# Patient Record
Sex: Female | Born: 1980 | Race: White | Hispanic: No | Marital: Married | Smoking: Never smoker
Health system: Southern US, Academic
[De-identification: ages and names within clinical notes are randomized; demographics above are authoritative.]

---

## 2003-04-20 ENCOUNTER — Emergency Department (HOSPITAL_COMMUNITY): Payer: Self-pay | Admitting: Emergency Medicine

## 2003-12-07 ENCOUNTER — Ambulatory Visit (HOSPITAL_COMMUNITY): Payer: Self-pay

## 2003-12-14 ENCOUNTER — Ambulatory Visit (INDEPENDENT_AMBULATORY_CARE_PROVIDER_SITE_OTHER): Payer: Self-pay

## 2020-07-13 IMAGING — MR MRI LUMBAR SPINE WITHOUT AND WITH CONTRAST
9 series · 48 of 48 positions shown · IV contrast (gadavist)
Comparison: None available.

﻿EXAM:  38062   MRI LUMBAR SPINE WITHOUT AND WITH CONTRAST
INDICATION: Lower back pain with bilateral lower extremity radiculopathy.
TECHNIQUE: Multiplanar multisequential MRI of the lumbar spine was performed without and with 4 mL of Gadavist.

[Series 8: T2 · sagittal · 4.0mm · 0.94mm/px · 5 of 13 slices shown (1 of 3)]
[im 1/13]
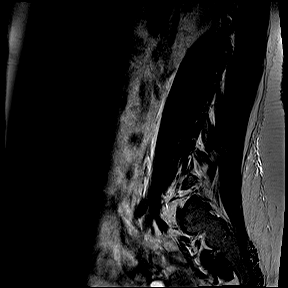
[im 4/13]
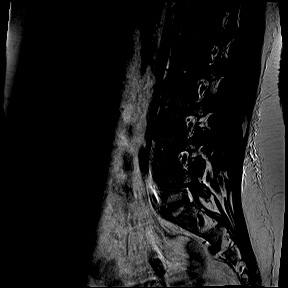
[im 7/13]
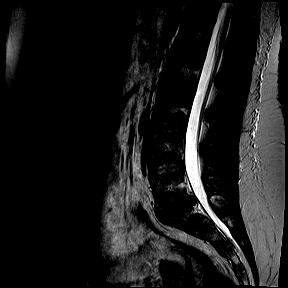
[im 10/13]
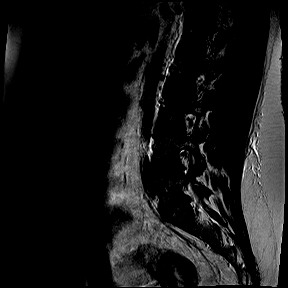
[im 13/13]
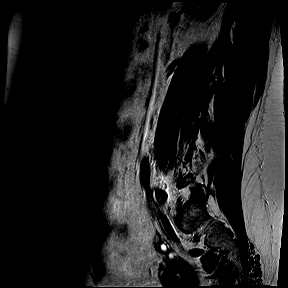

[Series 9: T1 · sagittal · 4.0mm · 0.94mm/px · 4 of 13 slices shown]
[im 1/13]
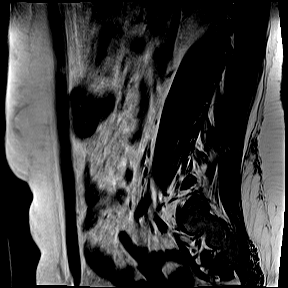
[im 5/13]
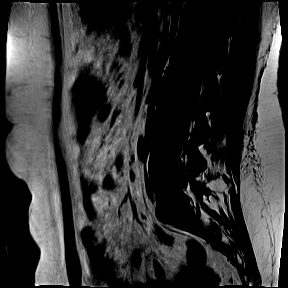
[im 9/13]
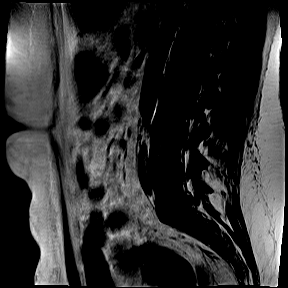
[im 13/13]
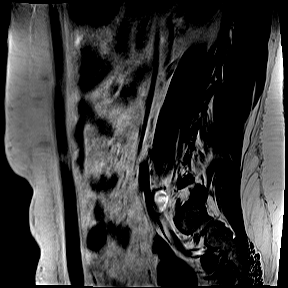

[Series 11: T1 fat-sat · sagittal · 4.0mm · 1.05mm/px · 4 of 13 slices shown (1 of 2)]
[im 1/13]
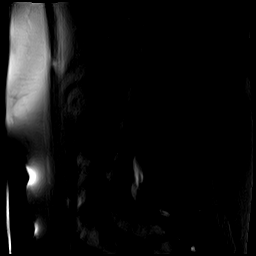
[im 5/13]
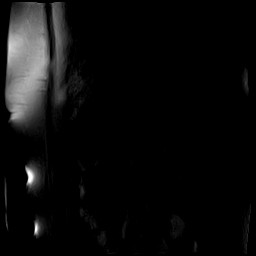
[im 9/13]
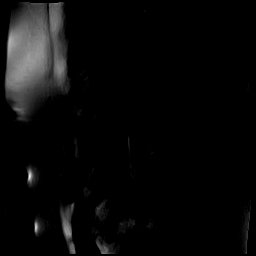
[im 13/13]
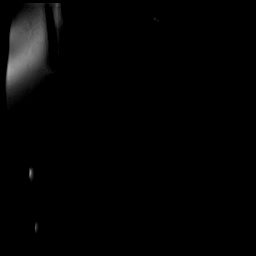

[Series 12: STIR · sagittal · 4.0mm · 1.05mm/px · 4 of 13 slices shown]
[im 1/13]
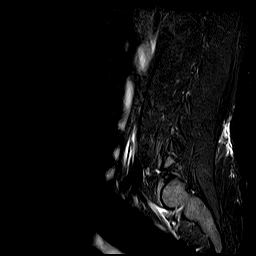
[im 5/13]
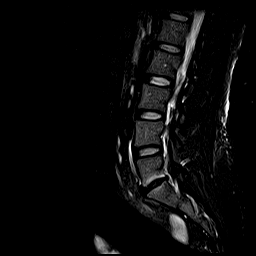
[im 9/13]
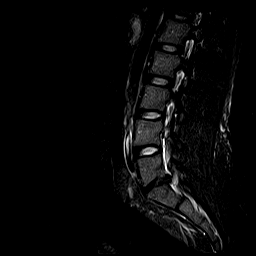
[im 13/13]
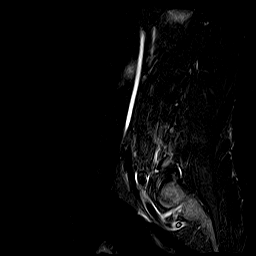

[Series 13: T2 · axial · 4.0mm · 0.47mm/px · z∈[-94,+102]mm · 7 of 23 slices shown (2 of 3)]
[im 1/23]
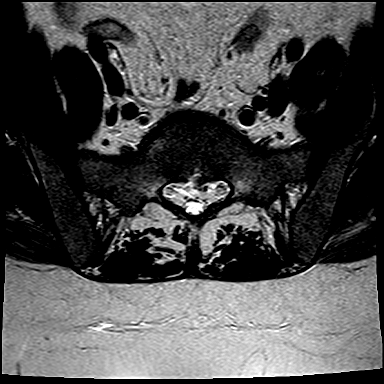
[im 4/23]
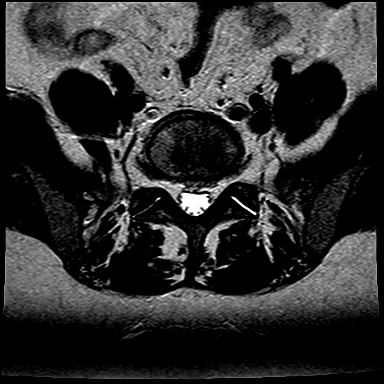
[im 8/23]
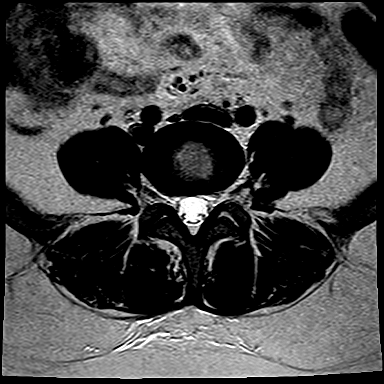
[im 12/23]
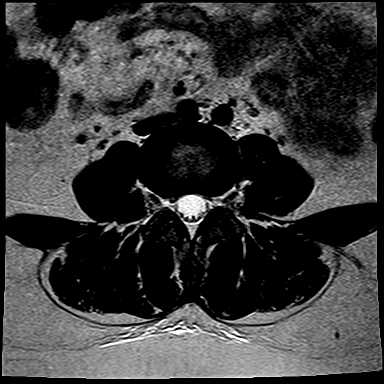
[im 15/23]
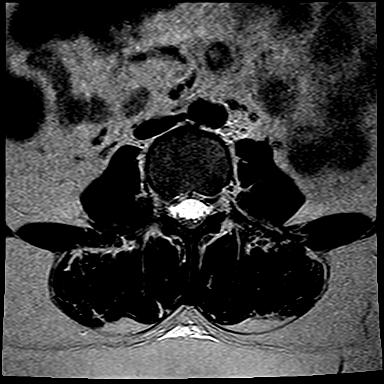
[im 19/23]
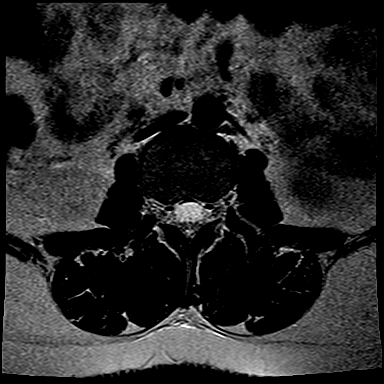
[im 23/23]
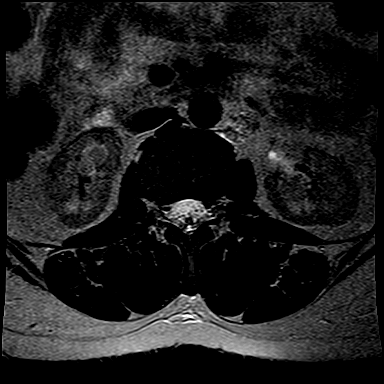

[Series 14: T1 fat-sat · axial · 4.0mm · 0.70mm/px · z∈[-94,+102]mm · 7 of 23 slices shown (2 of 2)]
[im 1/23]
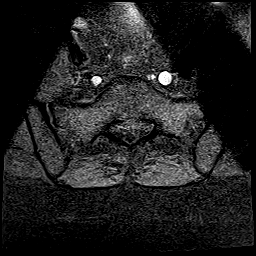
[im 4/23]
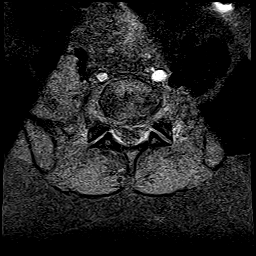
[im 8/23]
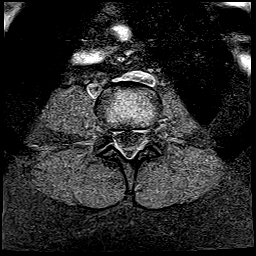
[im 12/23]
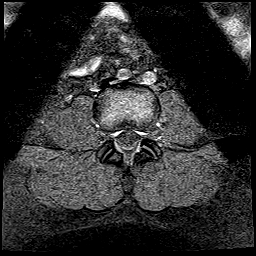
[im 15/23]
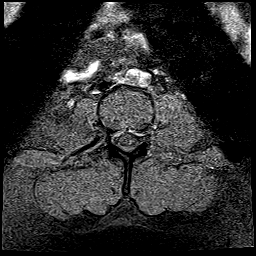
[im 19/23]
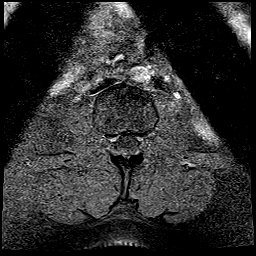
[im 23/23]
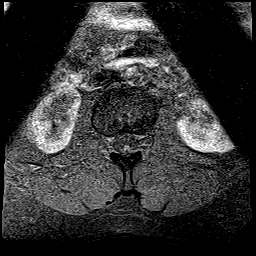

[Series 15: T1 fat-sat post-contrast · sagittal · 4.0mm · 1.05mm/px · 4 of 13 slices shown (1 of 2)]
[im 1/13]
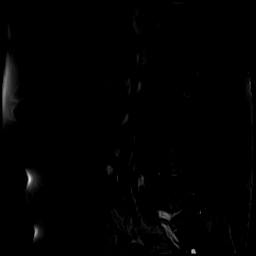
[im 5/13]
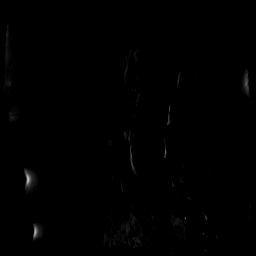
[im 9/13]
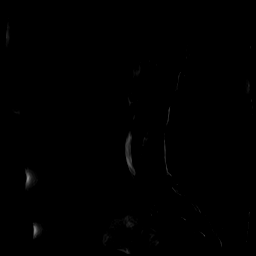
[im 13/13]
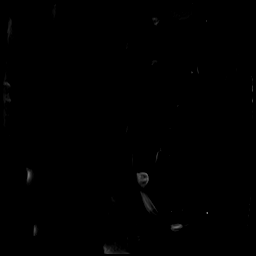

[Series 16: T1 fat-sat post-contrast · axial · 4.0mm · 0.70mm/px · z∈[-94,+102]mm · 7 of 23 slices shown (2 of 2)]
[im 1/23]
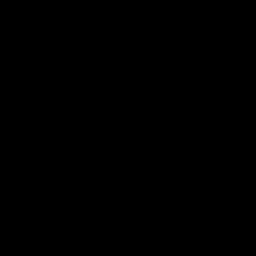
[im 4/23]
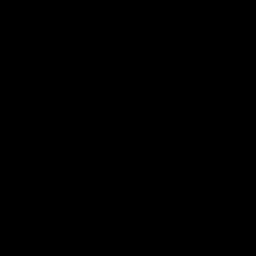
[im 8/23]
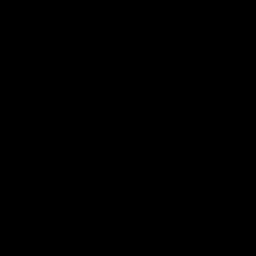
[im 12/23]
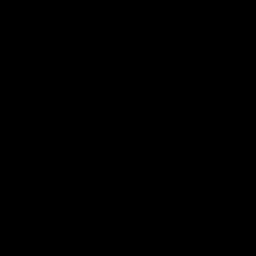
[im 15/23]
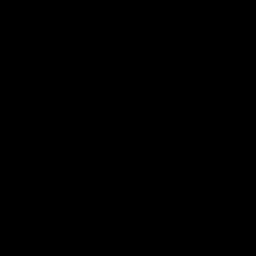
[im 19/23]
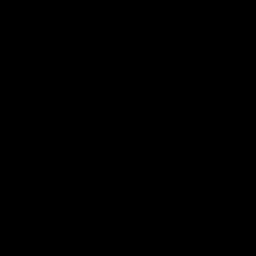
[im 23/23]
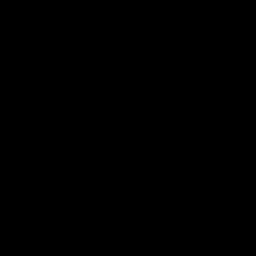

[Series 17: T2 · coronal · 4.0mm · 1.22mm/px · 6 of 20 slices shown (3 of 3)]
[im 1/20]
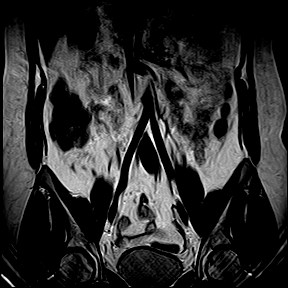
[im 4/20]
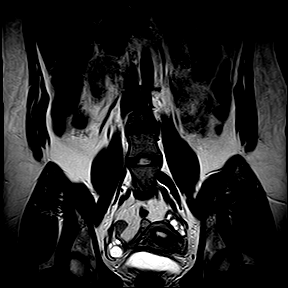
[im 8/20]
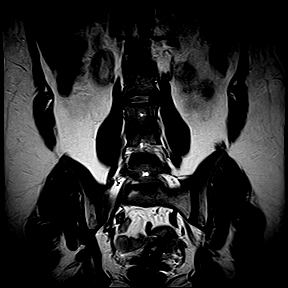
[im 12/20]
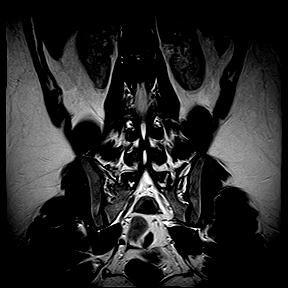
[im 16/20]
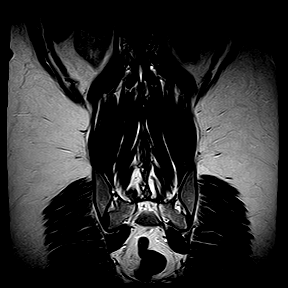
[im 20/20]
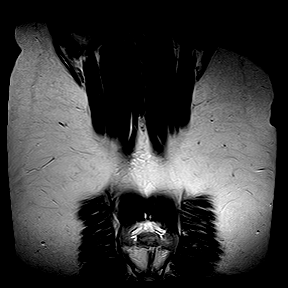

[48 of 48 positions shown; findings below may reference images not displayed]

FINDINGS: Vertebral bodies are normal in height, alignment and signal intensity. There is no acute fracture or subluxation. Distal spinal cord is normal in signal intensity and terminates normally at L1 vertebral body level. Spinal canal is congenitally narrow.

L1-2, L2-3, L3-4 and L4-5 levels are unremarkable.

At L5-S1 level, there is moderate disc desiccation. There is a moderate broad-based central and left paracentral disc bulge resulting in moderate left lateral recess compression. There is mild bilateral neural foraminal stenosis from facet arthropathy and bulging annulus without nerve root impingement.

Following intravenous contrast administration, there is no abnormal nerve root or paraspinal soft tissue enhancement.
IMPRESSION: Moderate left lateral recess compression at L5-S1 level from a moderate central and left paracentral disc bulge.

## 2020-11-01 ENCOUNTER — Ambulatory Visit: Payer: 59

## 2020-11-25 ENCOUNTER — Other Ambulatory Visit (INDEPENDENT_AMBULATORY_CARE_PROVIDER_SITE_OTHER): Payer: Self-pay | Admitting: ANESTHESIOLOGY

## 2020-11-25 DIAGNOSIS — M5126 Other intervertebral disc displacement, lumbar region: Secondary | ICD-10-CM

## 2020-11-25 DIAGNOSIS — M545 Low back pain, unspecified: Secondary | ICD-10-CM

## 2020-12-02 ENCOUNTER — Ambulatory Visit: Payer: 59 | Attending: ANESTHESIOLOGY | Admitting: ANESTHESIOLOGY

## 2020-12-02 ENCOUNTER — Other Ambulatory Visit: Payer: Self-pay

## 2020-12-02 DIAGNOSIS — M5416 Radiculopathy, lumbar region: Secondary | ICD-10-CM | POA: Insufficient documentation

## 2020-12-02 DIAGNOSIS — M545 Low back pain, unspecified: Secondary | ICD-10-CM | POA: Insufficient documentation

## 2020-12-02 DIAGNOSIS — M5126 Other intervertebral disc displacement, lumbar region: Secondary | ICD-10-CM | POA: Insufficient documentation

## 2020-12-02 NOTE — Nursing Note (Signed)
Pain and Function:     [place] Pain Rating Scale     On a scale of 0-10, during the past 24 hours, pain has interfered with you usual activity: 6     On a scale of 0-10, during the past 24 hours, pain has interfered with your sleep: 8    On a scale of 0-10, during the past 24 hours, pain has affected your mood: 7     On a scale of 0-10, during the past 24 hours, pain has contributed to your stress: 7     On a scale of 0-10, what is your overall pain Rating: 6

## 2020-12-02 NOTE — Patient Instructions (Signed)
[facility]IN [facility]NA[facility], [facility]  [address]  [place] [address]  Dept: [phone]  Dept Fax: [phone]  [phone]                                                 SPECIAL PROCEDURES                                     DISCHARGE FORM                                          [id number]      Please follow the instructions listed below for your procedures.  If you have questions concerning your procedure, you may call and leave a message.  Messages will be returned by the end of the next business day.  If you have an emergency, proceed to your local Emergency Department.      PROCEDURE: LUMBAR EPIDURAL STEROID INJECTION     Do not drive a car or operate machinery until tomorrow.   Rest today and return to normal activities tomorrow.   If you are on restricted activities by your physician, please continue to follow these.  If you are not sure, contact your physician.   It is possible to experience mild numbness of the lower back and legs.  This is temporary.   If you have soreness at the injection site, the application of heat or ice may be helpful. Mild analgesics may also be used.   In case of severe headache; lie flat to decrease it.  Increase all fluids especially those with caffeine.  Mild analgesics are also appropriate.  If headache is not relieved by these measures, contact the Pain Clinic.   Steroid injections may cause temporary increase of blood sugar levels.    These instructions have been reviewed with the patient and appropriate questions have answers.  Jerlyn Ly, LPN 72/09/2058 15:61

## 2020-12-02 NOTE — Procedures (Signed)
[facility], [facility]  [address]  [place] [address]  [phone]    PATIENT NAME: April Sullivan  CHART NGEXBM:[id number]  DATE OF BIRTH: 09/24/1980  DATE OF SERVICE:12/02/2020    SITE OF SERVICE  [facility].    Pre Procedure Diagnosis: Lumbar disc herniation, lumbar radiculopathy    Post Procedure Diagnosis: Same    Procedure: Lumbar Epidural Steroid Injection Under Fluoroscopy at  L5-S1    Attending: Sharon Seller, DO    Assistant: None    Anesthesia: Local    Estimated Blood Loss: None    Complication: None    Procedure:  After written, informed consent was obtained, the patient was taken to the procedure suite and placed in the prone position on the fluoroscopy table.  Anatomic landmarks for approach to the L5-S1 interlaminar space using fluoroscopic guidance and the patients skin was marked.  The patients skin was prepped and draped in the usual sterile fashion using Chlorhexidine.  A timeout was performed.  The skin and subcutaneous tissue was infiltrated with of 1% Lidocaine using a 25 gauge 1.5 inch needle.  The 18-gauge Tuohy needle was advanced with the aid of fluoroscopy using the loss of resistance technique at the L5-S1 interspinous space using an intralaminer approach.  Proper placement into the epidural space was confirmed by the loss of resistance.  AP and lateral radiographs were obtained to confirm proper needle tip placement. There was no CSF, heme or paresthesias.  After negative aspiration, an injectate consisting of 16mg  of Dexamethasone with 10mL of 0.2% Ropivicaine was injected.  There were no complications.   All needles were withdrawn intact and hemostasis was maintained throughout. The patient tolerated the procedure well.  The skin was cleaned and band-aids placed as appropriate.  The patient was then returned to the recovery area where they were monitored for and appropriate period of time during which they remained  hemodynamically stable and neurovascularly intact as prior to the procedure.  The patient was then discharged home in good condition with written discharge and follow up instructions.  Pre-procedure pain : 6/10  Post-procedure pain: 4/10    April Sullivan 6/10, DO 12/02/2020, 17:38

## 2020-12-02 NOTE — Progress Notes (Deleted)
[facility] Management, [facility]  [address]  [place] [address]  [phone]    PATIENT NAME: April Sullivan  CHART QQIWLN:[id number]  DATE OF BIRTH: [birth year]  DATE OF SERVICE:12/02/2020    SITE OF SERVICE  [facility].    Pre Procedure Diagnosis: Lumbar degenerative disc disease, lumbar spinal stenosis, lumbar radiculopathy    Post Procedure Diagnosis: Same    Procedure: Lumbar Epidural Steroid Injection Under Fluoroscopy at  L5-S1    Attending: Sharon Seller, DO    Assistant: None    Anesthesia: Local    Estimated Blood Loss: None    Complication: None    Procedure:  After written, informed consent was obtained, the patient was taken to the procedure suite and placed in the prone position on the fluoroscopy table.  Anatomic landmarks for approach to the L5-S1 interlaminar space using fluoroscopic guidance and the patients skin was marked.  The patients skin was prepped and draped in the usual sterile fashion using Chloraprep.  A timeout was performed.  The skin and subcutaneous tissue was infiltrated with of 1% Lidocaine using a 25 gauge 1.5 inch needle.  The 18-gauge Tuohy needle was advanced with the aid of fluoroscopy using the loss of resistance technique at the L5-S1 interspinous space using an intralaminer approach.  Proper placement into the epidural space was confirmed by the loss of resistance.  AP and lateral radiographs were obtained to confirm proper needle tip placement. There was no CSF, heme or paresthesias.  After negative aspiration, an injectate consisting of 16mg  of Dexamethasone with 13mL of 0.2% Ropivicaine was injected.  There were no complications.   All needles were withdrawn intact and hemostasis was maintained throughout. The patient tolerated the procedure well.  The skin was cleaned and band-aids placed as appropriate.  The patient was then returned to the recovery area where they were monitored for and appropriate period of time  during which they remained hemodynamically stable and neurovascularly intact as prior to the procedure.  The patient was then discharged home in good condition with written discharge and follow up instructions.  Pre-procedure pain : 6/10  Post-procedure pain: 4/10    Renlee Floor Michel Layne-Stuart, DO 12/02/2020, 10:21

## 2020-12-13 ENCOUNTER — Telehealth (INDEPENDENT_AMBULATORY_CARE_PROVIDER_SITE_OTHER): Payer: Self-pay | Admitting: Orthopaedic Surgery of the Spine

## 2020-12-13 NOTE — Nursing Note (Signed)
Message  Received: Karleen Dolphin, [name], RN  Rosemarie Beath! Can you let the patient know that we cannot prescribe anything until we see her in clinic. Thank you.          Previous Messages  Message from Rudene Re sent at 12/12/2020 1:17 PM EDT    Shea Evans pt     Pt calling in stating that she completed her appt with the pain clinic. She is still in severe pain after treatment. She would like to know if there is anything else that can be done until her appt on 11/28.   She would like to make you aware that she does not want narcotics.   Please return her call to discuss.   Thank you!        Call History     Type Contact Phone/Fax User   12/12/2020 01:13 PM EDT Phone (Incoming) Madi, Bonfiglio (Self) [phone] Rudene Re       Returned call to the patient and advised her that until she is seen we are not able to provide any script or make any recommendations other that the conservative treatment options. She verbalized understanding and thanked me for the call back.  Durenda Hurt, RN  12/13/2020, 08:16

## 2021-01-23 ENCOUNTER — Other Ambulatory Visit (INDEPENDENT_AMBULATORY_CARE_PROVIDER_SITE_OTHER): Payer: 59

## 2021-01-23 ENCOUNTER — Ambulatory Visit: Payer: 59 | Attending: Orthopaedic Surgery of the Spine | Admitting: Orthopaedic Surgery of the Spine

## 2021-01-23 ENCOUNTER — Other Ambulatory Visit: Payer: Self-pay

## 2021-01-23 ENCOUNTER — Encounter (INDEPENDENT_AMBULATORY_CARE_PROVIDER_SITE_OTHER): Payer: Self-pay | Admitting: Orthopaedic Surgery of the Spine

## 2021-01-23 VITALS — BP 116/73 | HR 80 | Temp 97.9°F | Ht 70.0 in | Wt 214.7 lb

## 2021-01-23 DIAGNOSIS — M545 Low back pain, unspecified: Secondary | ICD-10-CM

## 2021-01-23 DIAGNOSIS — M5117 Intervertebral disc disorders with radiculopathy, lumbosacral region: Secondary | ICD-10-CM

## 2021-01-23 MED ORDER — GABAPENTIN 300 MG CAPSULE
300.0000 mg | ORAL_CAPSULE | Freq: Three times a day (TID) | ORAL | 0 refills | Status: DC
Start: 2021-01-23 — End: 2023-01-09

## 2021-01-23 NOTE — H&P (Signed)
PATIENT NAME: April Sullivan, April Sullivan   HOSPITAL NUMBER:  [id number]  DATE OF SERVICE: 01/23/2021  DATE OF BIRTH:  04/10/1980    HISTORY AND PHYSICAL    HISTORY OF PRESENT ILLNESS:  This is a 40 year old new patient who presents to Dr. Mendel Corning clinic.  She has about at least 2 years of bilateral leg pain.  She said the pain is a lot worse on the left than the right.  The left leg pain goes kind of in her lateral hip, down the outside of her leg and into her left foot.  She occasionally does have the same pain on her right side.  It is worse in to her hip though, and not as bad into her right foot.  Over the past year, she has started to develop low back pain.  All this came on atraumatically.  It seems to be worsening over the recent months.  She has tried a Land, which did not help.  She did not have physical therapy.  She takes ibuprofen, but it does not really help her.  On December 02, 2020, she had L5-S1 epidural.  She said that helped for about 3 weeks.  It helped with her left leg pain almost entirely.  Her right leg pain was still present and it really did not help her back pain that much.  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  She still is able to do all of her work, but is painful.  Walking around actually helps.  Different positions such as sleeping on her back or sitting for a period of time will exacerbate her pain.    PAST MEDICAL HISTORY:  Denies.    PAST SURGICAL HISTORY:  Denies.    MEDICATIONS:  Denies.    ALLERGIES:  She is allergic to SULFA, causes a rash.    SOCIAL HISTORY:  She works full-time as an International aid/development worker at a casino in [place].  She denies tobacco use.  She drinks alcohol seldomly.    FAMILY HISTORY:  No family history.    REVIEW OF SYSTEMS:  Except as mentioned in HPI, complete review of systems was performed and all otherwise negative.    PHYSICAL EXAMINATION:  Vitals:  Blood pressure 116/73, pulse 80, temperature 36.6 degrees Celsius.  This is a well-appearing female in no apparent  distress.  She has 5/5 strength in bilateral lower extremities.  She has normal sensation in bilateral lower extremities.  She has no pathologic reflexes.  She has positive straight leg raise bilaterally that really only causes pain in her buttock.  It does not radiate down either leg.    IMAGING:  AP, lateral, flexion, and extension x-rays taken today in clinic were reviewed with Dr. Shea Evans.  Per his interpretation they show she has some disk height loss at L5-S1.  MRI from July 2022 reviewed.  Per Dr. Mendel Corning interpretation, it shows that she has a paracentral disk herniation on the left at L5-S1 that could be causing some irritation of the left S1 nerve root.    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  We explained to her that we definitely think that her disk herniation could be causing left S1 radiculopathy. We would be a little bit surprised if her right leg symptoms were coming from it.  The back pain could be coming from her disk height loss at L5-S1 as she has a lot of disk height loss at that level that is isolated.  At this  time, she has not really tried any gabapentin which we think would be a good option for her.  She also has not done any formal physical therapy.  She wrote her script for physical therapy to work on general core strengthening and low back strengthening.  We also sent her script for 300 mg t.i.d. of gabapentin.  We would like her to try this for a couple months.  She will then call the clinic to let us know how she is doing.  We explained to her that surgery for her to be complicated because we do not know if we would do just a diskectomy or if we would need to also do L5-S1 fusion.  Again, with the degenerative nature of that disease, Dr. Shea Evans is not sure if he would do it himself or if he would assistance of one of his partners since he will be retiring soon.  At this time, we will try the gabapentin and the PT.  She will call us in a couple months and  let us know how she is doing.  If she is not doing well, we would consider ordering another L5-S1 epidural or bring her back for a surgical discussion.  She is agreeable with this plan.  All questions were answered.        Roma Kayser, MD        I saw and examined the patient.  I reviewed the resident's note.  I agree with the findings and plan of care as documented in the resident's note.  Any exceptions/additions are edited/noted.  Complex case. Not classic S1 radiculopathy, moderate amount of low back pain. Functioning with this over about 3-5 years.  Unclear why would have right leg pain. Would try to re emphasize core strengthening and try Neurontin.  Could try a discectomy.  If back pain is intolerable then could do a discectomy and L5-S1 fusion, but then you buy the 30% chance of adjacent segment problems. Stay conservative for now.      Arta Silence, MD      Arta Silence, MD  Professor and Chair   [facility]                 DD:  01/23/2021 12:53:30  DT:  01/23/2021 13:50:10 CB  D#:  [id number]

## 2021-01-24 DIAGNOSIS — M5136 Other intervertebral disc degeneration, lumbar region: Secondary | ICD-10-CM

## 2021-06-07 ENCOUNTER — Telehealth (INDEPENDENT_AMBULATORY_CARE_PROVIDER_SITE_OTHER): Payer: Self-pay | Admitting: Orthopaedic Surgery

## 2021-06-07 NOTE — Nursing Note (Signed)
MRI request  Received: Today  Lucianne Lei [facility]        Message from Charlcie Cradle sent at 06/07/2021 11:33 AM EDT    Summary: MRI request    Daffner pt     Pt calling to see if she needs a new MRI for 6.7   Stating she had one done 5.2022, is asking if she needs a new one for this upcoming appt   Please advise/return call   Thank you!                   Call History     Type Contact Phone/Fax User   06/07/2021 11:32 AM EDT Phone (Incoming) Virga, [place] (Self) [phone] Judie Petit) Shawnee Knapp, Noah Early and spoke with the patient and messaged Irving Burton PA-C:  Irving Burton - I called and she isn't going any better and worse she feels. The injection lasted for 20 days. The pain is still down both hips/legs and down to her feet to the tip of her toe. It isn't all the time but getting more frequent over the last few months. She finished her PT in January but has continued to do it at home. She had got facial [facility] and was not able to go there in person but did do them. She wants to know if you want to see her first or get an MRI before she comes? Candise Bowens 4/12 @ 1345   Durenda Hurt, RN  06/07/2021, 13:55

## 2021-06-08 ENCOUNTER — Telehealth (INDEPENDENT_AMBULATORY_CARE_PROVIDER_SITE_OTHER): Payer: Self-pay | Admitting: Orthopaedic Surgery

## 2021-06-08 ENCOUNTER — Other Ambulatory Visit (INDEPENDENT_AMBULATORY_CARE_PROVIDER_SITE_OTHER): Payer: Self-pay | Admitting: Physician Assistant

## 2021-06-08 DIAGNOSIS — M5416 Radiculopathy, lumbar region: Secondary | ICD-10-CM

## 2021-06-08 NOTE — Nursing Note (Signed)
FW: MRI request  Received: Today  Courtwright, [name], RN  You can tell her that I reviewed her MRI and because she has a large disk herniation and her symptoms have not improved we will want to get an new MRI prior to seeing her ---to see if this has changed at all in order to discuss possible surgical intervention.     Order is in.     Thanks!          Previous Messages      ----- Message -----   From: Charlcie Cradle   Sent: 06/07/2021 11:33 AM EDT   To: Ortho Dr Herbie Saxon Service   Subject: MRI request                         Message from Charlcie Cradle sent at 06/07/2021 11:33 AM EDT    Summary: FW: MRI request    April Sullivan     Sullivan calling to see if she needs a new MRI for 6.7   Stating she had one done 5.2022, is asking if she needs a new one for this upcoming appt   Please advise/return call   Thank you!                 Call History     Type Contact Phone/Fax User   06/07/2021 11:32 AM EDT Phone (Incoming) Sidrah, Harden (Self) [phone] Judie Petit) Shawnee Knapp, Marolyn Hammock     Returned call to the patient and advised her to the above. She verbalized understanding and thanked me for the call back.  Durenda Hurt, RN  06/08/2021, 09:06

## 2021-08-02 ENCOUNTER — Ambulatory Visit: Payer: 59 | Attending: Orthopaedic Surgery | Admitting: Orthopaedic Surgery

## 2021-08-02 ENCOUNTER — Inpatient Hospital Stay (HOSPITAL_BASED_OUTPATIENT_CLINIC_OR_DEPARTMENT_OTHER)
Admission: RE | Admit: 2021-08-02 | Discharge: 2021-08-02 | Disposition: A | Discharge status: Home / Self Care | Payer: 59 | Source: Ambulatory Visit

## 2021-08-02 ENCOUNTER — Other Ambulatory Visit: Payer: Self-pay

## 2021-08-02 ENCOUNTER — Encounter (INDEPENDENT_AMBULATORY_CARE_PROVIDER_SITE_OTHER): Payer: Self-pay | Admitting: Orthopaedic Surgery

## 2021-08-02 VITALS — BP 129/87 | HR 99 | Ht 70.0 in | Wt 217.4 lb

## 2021-08-02 DIAGNOSIS — M5126 Other intervertebral disc displacement, lumbar region: Secondary | ICD-10-CM

## 2021-08-02 DIAGNOSIS — M5416 Radiculopathy, lumbar region: Secondary | ICD-10-CM

## 2021-08-02 DIAGNOSIS — M5136 Other intervertebral disc degeneration, lumbar region: Secondary | ICD-10-CM

## 2021-08-02 DIAGNOSIS — M533 Sacrococcygeal disorders, not elsewhere classified: Secondary | ICD-10-CM | POA: Insufficient documentation

## 2021-08-02 NOTE — Progress Notes (Signed)
[facility]  [address]  [place] [address]  Operated by [facility]     Name: April Sullivan MRN:  [id number]   Date: 08/02/2021 Age: 41 y.o.     PROGRESS NOTE    SUBJECTIVE  Patient is a 41 y.o. female who presents to the office today for evaluation of low back pain and bilateral leg pain.  She was previously seen by Dr. Shea Evans regarding this.  She reports pain that radiates down the posterior upper leg and lower leg to the ankle.  She describes the pain as sharp/shooting, more intense with laying, sitting, bending, and activity.  Pain is perceived as severe.  Pain of the left leg is greater than the right.  She has tried physical therapy, chiropractic care, NSAIDs, gabapentin, and L5-S1 epidural injection.  Her only relief of pain was with the epidural injection.  She had complete relief of left leg symptoms following injection for 20 days.  Right legs symptoms worsened.  Over the past year her symptoms have significantly worsened over the past year.  Pain has been present for 5 years now.      Patient reports difficulty with walking and states that her foot drags when she walks.  She denies issues with balance.  She denies problems with dexterity and fine motor skills such as buttoning buttons, picking up coins, and dropping items. She denies recent changes in handwriting. Patient denies numbness, tingling, fever, night sweats, chills, difficulty with bladder/bowel function, and unintentional weight loss.     OBJECTIVE  Patient is a pleasant a very pleasant 41 y.o. female in no acute distress.  Patient is alert and oriented.  Mood and affect are appropriate. Respirations are non-labored and no audible wheezing is present.  Gait is antalgic. No pinpoint tenderness over greater trochanteric bursa bilaterally.  Tenderness over spinous processes and paraspinals.  SI provocation testing positive.  Straight leg raise negative.  Strength is 5/5 bilaterally in LE with ankle plantar/dorsiflexion, knee  flexion/extension, and hip flexion.  Reflexes are +2 in LE bilaterally.  No clonus present.    IMAGING  MRI of the lumbar spine obtained today was independently reviewed/interpreted by myself and Dr. Herbie Saxon today and reveals improvement of L5-S1 disc herniation, disc degeneration L5-S1.    ASSESSMENT AND PLAN   Patient is a 41 y.o. 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  She has tried physical therapy, NSAIDs, gabapentin and epidural injection without relief of symptoms.  We recommend that she try bilateral L5 selective nerve root blocks.  We will see her back in clinic 2-3 weeks after injection.  If there is no improvement in symptoms we can consider SI injection.    I have answered all questions and the patient is pleased with the plan of treatment. If the patient has any questions or concerns, the patient can contact us.     I have seen this patient in conjunction with cosigning physician    Rachel Moulds, PA-C  08/02/2021, 15:25    I personally saw and examined the patient. See mid-level's note for additional details. My findings are lumbar radicular pain.    Neta Ehlers, MD  Professor  Department of Orthopaedics        CC:  Leafy Kindle, NP  [address]  [place] [address]     Self, Referral  No address on file  Terald Sleeper, MD  Professor  Department of [place]

## 2021-08-07 DIAGNOSIS — M5137 Other intervertebral disc degeneration, lumbosacral region: Secondary | ICD-10-CM

## 2021-08-07 DIAGNOSIS — M5127 Other intervertebral disc displacement, lumbosacral region: Secondary | ICD-10-CM

## 2021-08-07 DIAGNOSIS — M9973 Connective tissue and disc stenosis of intervertebral foramina of lumbar region: Secondary | ICD-10-CM

## 2021-08-07 DIAGNOSIS — M47817 Spondylosis without myelopathy or radiculopathy, lumbosacral region: Secondary | ICD-10-CM

## 2021-08-31 ENCOUNTER — Other Ambulatory Visit: Payer: Self-pay

## 2021-08-31 ENCOUNTER — Encounter (HOSPITAL_COMMUNITY)
Admission: RE | Disposition: A | Discharge status: Home / Self Care | Payer: Self-pay | Source: Ambulatory Visit | Attending: Nuclear Radiology

## 2021-08-31 ENCOUNTER — Ambulatory Visit (HOSPITAL_COMMUNITY): Payer: 59 | Admitting: Nuclear Radiology

## 2021-08-31 ENCOUNTER — Ambulatory Visit
Admission: RE | Admit: 2021-08-31 | Discharge: 2021-08-31 | Disposition: A | Discharge status: Home / Self Care | Payer: 59 | Source: Ambulatory Visit | Attending: Nuclear Radiology | Admitting: Nuclear Radiology

## 2021-08-31 DIAGNOSIS — M5416 Radiculopathy, lumbar region: Secondary | ICD-10-CM | POA: Insufficient documentation

## 2021-08-31 SURGERY — IR NERVE BLOCK LUMBAR
Laterality: Bilateral

## 2021-08-31 MED ORDER — DEXAMETHASONE SODIUM PHOSPHATE (PF) 10 MG/ML INJECTION SOLUTION
INTRAMUSCULAR | Status: AC
Start: 2021-08-31 — End: 2021-08-31
  Filled 2021-08-31: qty 2

## 2021-08-31 MED ORDER — BUPIVACAINE (PF) 0.25 % (2.5 MG/ML) INJECTION SOLUTION
INTRAMUSCULAR | Status: AC
Start: 2021-08-31 — End: 2021-08-31
  Filled 2021-08-31: qty 30

## 2021-08-31 SURGICAL SUPPLY — 2 items
NEEDLE BIOPSY 22GA 15CM CHIBA STRL DISP ASP (MED SURG SUPPLIES) ×1 IMPLANT
NEEDLE BIOPSY 22GA 15CM CHIBA_STRL DISP ASP (MED SURG SUPPLIES) ×1

## 2021-10-11 ENCOUNTER — Ambulatory Visit: Payer: 59 | Attending: Orthopaedic Surgery | Admitting: Orthopaedic Surgery

## 2021-10-11 ENCOUNTER — Other Ambulatory Visit: Payer: Self-pay

## 2021-10-11 DIAGNOSIS — M5126 Other intervertebral disc displacement, lumbar region: Secondary | ICD-10-CM | POA: Insufficient documentation

## 2021-10-11 DIAGNOSIS — M5416 Radiculopathy, lumbar region: Secondary | ICD-10-CM | POA: Insufficient documentation

## 2021-10-11 DIAGNOSIS — M5418 Radiculopathy, sacral and sacrococcygeal region: Secondary | ICD-10-CM

## 2021-10-11 NOTE — Progress Notes (Signed)
[facility]  [address]  [place] [address]  Operated by [facility]     Name: April Sullivan MRN:  [id number]   Date: 10/11/2021 Age: 41 y.o.     PROGRESS NOTE    SUBJECTIVE  Patient is a 41 y.o. female who  presents to the office today for evaluation post IR nerve block L5-S1injection.  She reports after injection, the pain on her  right side improves almost 90% but the left side is the worse side. She stated if she had option to take away pain from one side would be left side.  She reports pain radiates down to buttock and upper leg.she describe the left sided pain as sharp, more intense with laying on her back, standing and sitting for long period of time. She reports she has adjustable bed at home and she tried changing they position she sleeps but she is not comfortable sleeping on her left side. Today she reports her right sided symptoms is better since the injection so she is pleased about it.  She reports gabapentin takes the edge away of the pain.     Patient reports difficulty with mostly bending position and laying on her back.  She denies issues with balance.  She denies problems with dexterity and fine motor skills such as buttoning buttons, picking up coins, and dropping items. . Patient denies numbness,  reports  some tingling on the left leg/foot ,  denies fever, night sweats, chills, difficulty with bladder/bowel function, and unintentional weight loss.     OBJECTIVE  Patient is a pleasant a very pleasant 41 y.o. female in no acute distress.  Patient is alert and oriented.  Marland Kitchen Respirations clear on auscultation bilaterally.  No weakness noted on one leg up testing today.  Tenderness noted over at [facility] joint.  SI provocation testing positive.  .  Strength is 5/5 bilaterally in LE with ankle plantar/dorsiflexion, knee flexion/extension, and hip flexion.  Reflexes are +2 in LE bilaterally.  No clonus present.    IMAGING   No images were done today. MRI of the lumbar spine  from  previous visit  showed improvement of L5-S1 disc herniation (compare to the past MRI),  disc degeneration L5-S1.    ASSESSMENT AND PLAN   Patient is a 41 y.o. 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   She had relief on her right leg after L5 selective nerve root blocks. We recommend that she try left S1 nerve root blocks, orders in place.  She is agreeable with the plan. We will see her back in clinic 2-3 weeks after injection.  In past She has tried physical therapy, NSAIDs, gabapentin and epidural injection without  much relief of symptoms . If there is no improvement in symptoms we can consider SI injection.    I have answered all questions and the patient is pleased with the plan of treatment. If the patient has any questions or concerns, the patient can contact us.     I have seen this patient in conjunction with cosigning physician    Mousumi Pinki, APRN,NP-C 10/11/2021, 11:25     I personally saw and examined the patient. See mid-level's note for additional details. My findings are S1 radicular pain     Neta Ehlers, MD  Professor  Department of Orthopaedics          CC:  Leafy Kindle, NP  [address]  [place] [address]     No referring provider defined for this encounter.

## 2021-11-16 ENCOUNTER — Other Ambulatory Visit: Payer: Self-pay

## 2021-11-16 ENCOUNTER — Encounter (HOSPITAL_COMMUNITY): Admission: RE | Disposition: A | Discharge status: Home / Self Care | Payer: Self-pay | Source: Ambulatory Visit

## 2021-11-16 ENCOUNTER — Ambulatory Visit
Admission: RE | Admit: 2021-11-16 | Discharge: 2021-11-16 | Disposition: A | Discharge status: Home / Self Care | Payer: 59 | Source: Ambulatory Visit | Attending: Nuclear Radiology | Admitting: Nuclear Radiology

## 2021-11-16 DIAGNOSIS — M5126 Other intervertebral disc displacement, lumbar region: Secondary | ICD-10-CM | POA: Insufficient documentation

## 2021-11-16 SURGERY — IR NERVE BLOCK LUMBAR
Laterality: Left

## 2021-11-16 MED ORDER — DEXAMETHASONE SODIUM PHOSPHATE (PF) 10 MG/ML INJECTION SOLUTION
INTRAMUSCULAR | Status: AC
Start: 2021-11-16 — End: 2021-11-16
  Filled 2021-11-16: qty 2

## 2021-11-16 MED ORDER — BUPIVACAINE (PF) 0.25 % (2.5 MG/ML) INJECTION SOLUTION
INTRAMUSCULAR | Status: AC
Start: 2021-11-16 — End: 2021-11-16
  Filled 2021-11-16: qty 30

## 2021-11-16 SURGICAL SUPPLY — 3 items
CONV USE 337891 - PACK SURG CSTM EPIDRL STRD INJ STRL DISP NRB LF (DRAPE/PACKS/SHEETS/OR TOWEL) ×1
CONV USE 337891 - PACK SURG CUSTOM EPIDRL STRD INJ STRL DISP NRB LF (DRAPE/PACKS/SHEETS/OR TOWEL) ×1 IMPLANT
TRAY ESI NERVE ROOT BLOCK CS/8 (DRAPE/PACKS/SHEETS/OR TOWEL) ×1

## 2021-11-17 MED ORDER — IOPAMIDOL 200 MG IODINE/ML (41 %) INTRATHECAL SOLUTION
1.0000 mL | INTRATHECAL | Status: AC
Start: 2021-11-16 — End: 2021-11-16
  Administered 2021-11-16: 1 mL via EPIDURAL

## 2021-11-17 MED ORDER — BUPIVACAINE (PF) 0.5 % (5 MG/ML) INJECTION SOLUTION
5.0000 mL | INTRAMUSCULAR | Status: DC
Start: 2021-11-16 — End: 2021-12-01
  Administered 2021-11-16: 5 mL via EPIDURAL

## 2021-11-17 MED ORDER — BUPIVACAINE (PF) 0.5 % (5 MG/ML) INJECTION SOLUTION
2.0000 mL | INTRAMUSCULAR | Status: DC
Start: 2021-11-16 — End: 2021-12-01
  Administered 2021-11-16: 2 mL

## 2021-11-17 MED ORDER — DEXAMETHASONE SODIUM PHOSPHATE (PF) 10 MG/ML INJECTION SOLUTION
12.0000 mg | Freq: Once | INTRAMUSCULAR | Status: AC
Start: 2021-11-16 — End: 2021-11-16
  Administered 2021-11-16: 12 mg via EPIDURAL

## 2021-12-01 MED ORDER — BUPIVACAINE (PF) 0.25 % (2.5 MG/ML) INJECTION SOLUTION
2.0000 mL | Freq: Once | INTRAMUSCULAR | Status: AC
Start: 2021-11-16 — End: 2021-11-16
  Administered 2021-11-16: 2 mL

## 2021-12-01 MED ORDER — BUPIVACAINE (PF) 0.25 % (2.5 MG/ML) INJECTION SOLUTION
5.0000 mL | Freq: Once | INTRAMUSCULAR | Status: AC
Start: 2021-11-16 — End: 2021-11-16
  Administered 2021-11-16: 5 mL via EPIDURAL

## 2022-01-03 ENCOUNTER — Other Ambulatory Visit: Payer: Self-pay

## 2022-01-03 ENCOUNTER — Encounter (INDEPENDENT_AMBULATORY_CARE_PROVIDER_SITE_OTHER): Payer: Self-pay | Admitting: Orthopaedic Surgery

## 2022-01-03 ENCOUNTER — Ambulatory Visit: Payer: 59 | Attending: Orthopaedic Surgery | Admitting: Orthopaedic Surgery

## 2022-01-03 DIAGNOSIS — M79605 Pain in left leg: Secondary | ICD-10-CM | POA: Insufficient documentation

## 2022-01-03 DIAGNOSIS — M79604 Pain in right leg: Secondary | ICD-10-CM | POA: Insufficient documentation

## 2022-01-03 DIAGNOSIS — M545 Low back pain, unspecified: Secondary | ICD-10-CM | POA: Insufficient documentation

## 2022-01-03 DIAGNOSIS — M533 Sacrococcygeal disorders, not elsewhere classified: Secondary | ICD-10-CM | POA: Insufficient documentation

## 2022-01-03 NOTE — Progress Notes (Addendum)
[facility]  [address]  [place] [address]  Operated by [facility]     Name: April Sullivan MRN:  [id number]   Date: 01/03/2022 Age: 41 y.o. DOB: [birth year]     Progress note:      SUBJECTIVE: This is a very pleasant patient who has been followed originally by Dr. Shea Evans and then Dr. Herbie Saxon.  She has been having pain that was in her low lumbar back versus the pelvis that wraps around into the buttocks and intermittently radiates down bilateral legs and can really seem to touch any location including all the way to the feet and toes.  She notes that since her last injection, the pain seems to be sitting and emanating from a lower position. She had injections:    08/31/21 BILAT L5 TransESI It was notated in chart that she got almost 90% relief on the right side, but perhaps not very good relief from the left.   11/16/21 LEFT S1 TransESI -feels like she got pretty good relief maybe 65% relief for a few weeks maybe even greater than that.      Moving makes it worse rolling over in bed switching from sitting to standing getting up off the toilet lying on hard surface.  She gets some relief from 10s unit.  She tried PT and chiropractor without any relief.    IMAGING:  X-ray lumbar ALF he shows some degenerative disc at L5-S1 no instability    OBJECTIVE:  Patient overall well-appearing she stands easily has an easy gait she does have a lot of pain she would some random locations as she is moving around such as when she is doing single leg step-up test she can complete single leg step-up bilaterally and calf raises bilaterally without fatiguing or apparent weakness she is a negative straight leg raise there is no significant tenderness to palpations ever been accordance point bilaterally.  She has a positive Gaenslen's test, thigh thrust.  She does get some pain with distraction test not significantly over the SI joint negative compression test    A&P:  Patient with some signs of SI joint dysfunction  should the somewhat challenging pattern of pain that is little bit difficult to elicit specifically where it is coming from she does have positive provocative tests for SI joint dysfunction she is amenable to bilateral SI joint injection by CT which is ordered.  We also discussed she should follow up with her PCP she is from [place] quite far away and see about following up with Rheumatology as her pain seems to be worse in the mornings it is somewhat in the hip girdle area could consider diagnoses like PMR, or other rheumatologic conditions.  We will see the patient back after her BILAT SI joint injection 2-4 weeks and see if this gave her great relief    Cathi Roan, NP  Department of Orthopaedics-Spine    I personally saw and examined the patient. See mid-level's note for additional details. My findings are back pain, seems to localize to SI joints, will try injection     Neta Ehlers, MD  Professor  [facility] Department of Orthopaedics

## 2022-02-05 ENCOUNTER — Inpatient Hospital Stay
Admission: RE | Admit: 2022-02-05 | Discharge: 2022-02-05 | Disposition: A | Discharge status: Home / Self Care | Payer: 59 | Source: Ambulatory Visit | Attending: Nurse Practitioner | Admitting: Nurse Practitioner

## 2022-02-05 ENCOUNTER — Other Ambulatory Visit: Payer: Self-pay

## 2022-02-05 DIAGNOSIS — M5137 Other intervertebral disc degeneration, lumbosacral region: Secondary | ICD-10-CM | POA: Insufficient documentation

## 2022-02-05 DIAGNOSIS — M25552 Pain in left hip: Secondary | ICD-10-CM | POA: Insufficient documentation

## 2022-02-05 DIAGNOSIS — M545 Low back pain, unspecified: Secondary | ICD-10-CM | POA: Insufficient documentation

## 2022-02-05 DIAGNOSIS — M533 Sacrococcygeal disorders, not elsewhere classified: Secondary | ICD-10-CM

## 2022-02-05 DIAGNOSIS — M7918 Myalgia, other site: Secondary | ICD-10-CM | POA: Insufficient documentation

## 2022-02-05 DIAGNOSIS — R1030 Lower abdominal pain, unspecified: Secondary | ICD-10-CM | POA: Insufficient documentation

## 2022-02-05 DIAGNOSIS — M25551 Pain in right hip: Secondary | ICD-10-CM | POA: Insufficient documentation

## 2022-02-05 DIAGNOSIS — G8929 Other chronic pain: Secondary | ICD-10-CM | POA: Insufficient documentation

## 2022-02-05 DIAGNOSIS — M461 Sacroiliitis, not elsewhere classified: Secondary | ICD-10-CM | POA: Insufficient documentation

## 2022-02-05 MED ORDER — TRIAMCINOLONE ACETONIDE 40 MG/ML SUSPENSION FOR INJECTION
INTRAMUSCULAR | Status: AC
Start: 2022-02-05 — End: 2022-02-05
  Filled 2022-02-05: qty 5

## 2022-02-05 MED ORDER — ROPIVACAINE (PF) 5 MG/ML (0.5 %) INJECTION SOLUTION
INTRAMUSCULAR | Status: AC
Start: 2022-02-05 — End: 2022-02-05
  Filled 2022-02-05: qty 30

## 2022-02-05 MED ORDER — LIDOCAINE HCL 10 MG/ML (1 %) INJECTION SOLUTION
INTRAMUSCULAR | Status: AC
Start: 2022-02-05 — End: 2022-02-05
  Filled 2022-02-05: qty 20

## 2022-02-05 MED ORDER — ROPIVACAINE (PF) 5 MG/ML (0.5 %) INJECTION SOLUTION
1.0000 mL | Freq: Once | INTRAMUSCULAR | Status: AC
Start: 2022-02-05 — End: 2022-02-05
  Administered 2022-02-05: 1 mL via INTRA_ARTICULAR

## 2022-02-05 MED ORDER — TRIAMCINOLONE ACETONIDE 40 MG/ML SUSPENSION FOR INJECTION
40.0000 mg | Freq: Once | INTRAMUSCULAR | Status: AC
Start: 2022-02-05 — End: 2022-02-05
  Administered 2022-02-05: 40 mg via INTRA_ARTICULAR

## 2022-02-05 MED ORDER — IOPAMIDOL 300 MG IODINE/ML (61 %) INTRAVENOUS SOLUTION
1.5000 mL | INTRAVENOUS | Status: AC
Start: 2022-02-05 — End: 2022-02-05
  Administered 2022-02-05: 1.2 mL via INTRA_ARTICULAR

## 2022-02-05 MED ORDER — LIDOCAINE HCL 10 MG/ML (1 %) INJECTION SOLUTION
7.0000 mL | Freq: Once | INTRAMUSCULAR | Status: AC
Start: 2022-02-05 — End: 2022-02-05
  Administered 2022-02-05: 70 mg

## 2022-02-05 NOTE — Discharge Instructions (Signed)
Discharge Instructions following SI Joint Injection     You may experience temporary numbness or weakness in your legs after the injection. When the numbing medication wears off, this should go away.   You should limit your activity and not drive for the remainder of the day.   You may experience a temporary increase in pain for the first 24-72 hours after the injection. This occurs because the local anesthetics were injected into an area where there is already inflammation. The anti-inflammatory effect from the steroid may take several days to take effect.    If the site of the injection is painful, you may apply an ice pack to the area to reduce the discomfort or take pain medication as directed by your physician.   You may resume all of your medications immediately following the injection, unless directed otherwise by your physician.    Please seek medical attention (PCP or local Emergency Dept.) if you experience fever, chills, redness or swelling at the injection site, increased pain, weakness or sensory changes or, rarely, changes in bladder and/or bowel function.     If it is indicated to call your physician, call [facility] at [phone], and ask the operator to page the Radiology Resident.  When calling after 4:00pm or on weekends, ask the operator to page the Radiology Resident On-Call.    If you have any further questions or concerns related to your post-procedure care, you may contact the Radiology Nurse Monday thru Friday, 7:00am-4:00pm at [phone].

## 2022-03-08 IMAGING — MR MRI LUMBAR SPINE WITHOUT CONTRAST
5 of 6 series · 32 of 48 positions shown · IV contrast (gadolinium)
Comparison: MRI dated 07/13/2020.

﻿EXAM:  [id number]   MRI LUMBAR SPINE WITHOUT CONTRAST
INDICATION: Worsening low back pain with radicular symptoms to left leg.  No history of back surgery.
TECHNIQUE: Multiplanar, multisequential MRI of the lumbosacral spine was performed without gadolinium contrast.

[Series 8: T2 · sagittal · 4.0mm · 0.94mm/px · 6 of 13 slices shown (1 of 3)]
[im 1/13]
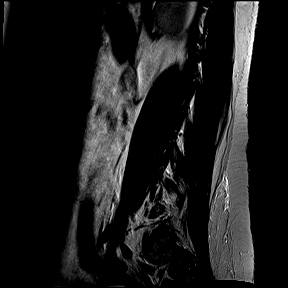
[im 3/13]
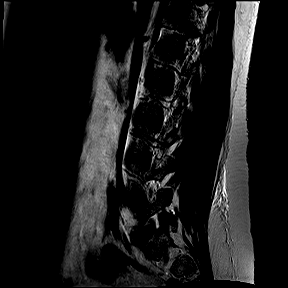
[im 5/13]
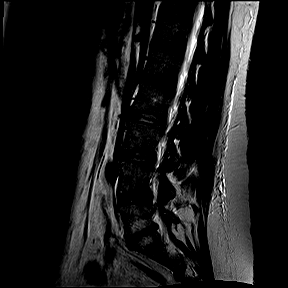
[im 8/13]
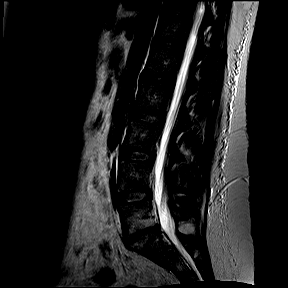
[im 10/13]
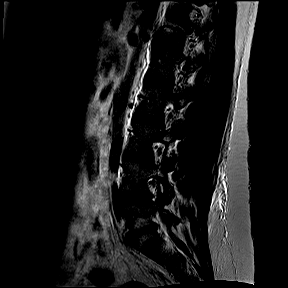
[im 13/13]
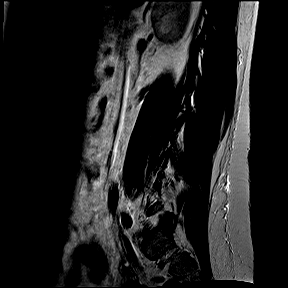

[Series 9: T1 · sagittal · 4.0mm · 0.94mm/px · 6 of 13 slices shown (1 of 2)]
[im 1/13]
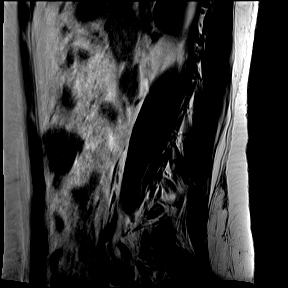
[im 3/13]
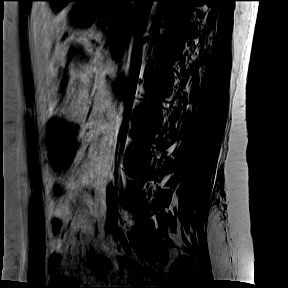
[im 5/13]
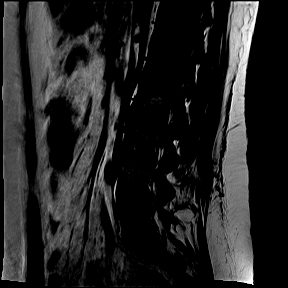
[im 8/13]
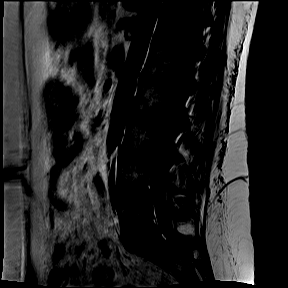
[im 10/13]
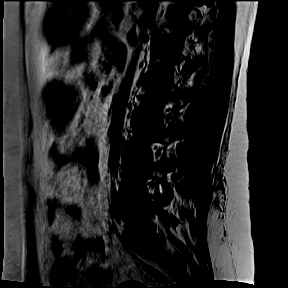
[im 13/13]
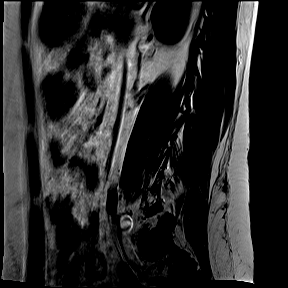

[Series 11: T2 · coronal · 5.0mm · 0.82mm/px · 8 of 18 slices shown (2 of 3)]
[im 1/18]
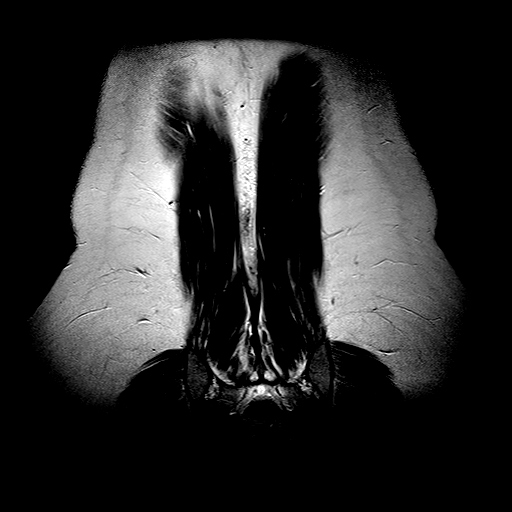
[im 3/18]
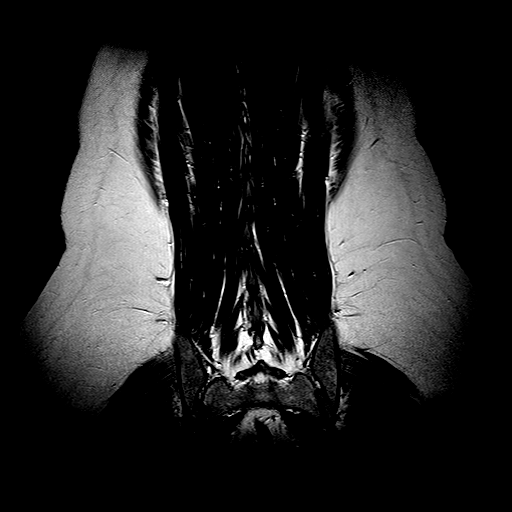
[im 5/18]
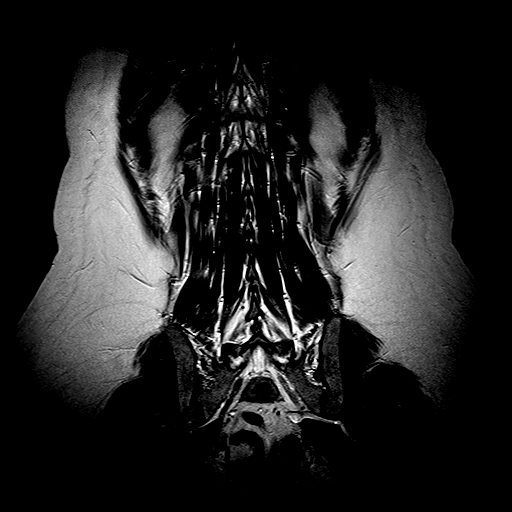
[im 8/18]
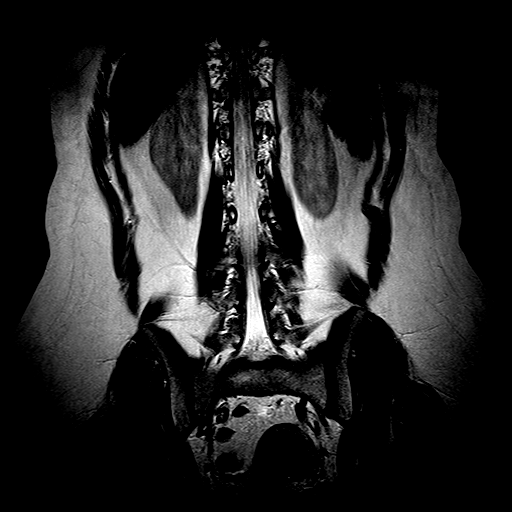
[im 10/18]
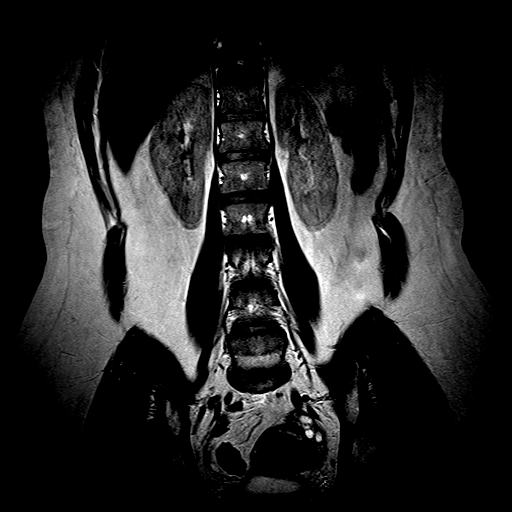
[im 13/18]
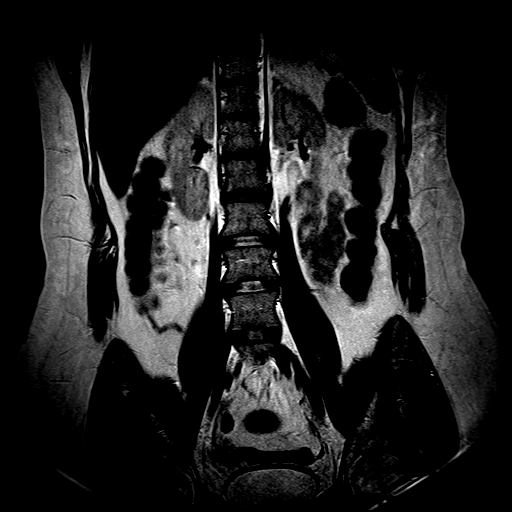
[im 15/18]
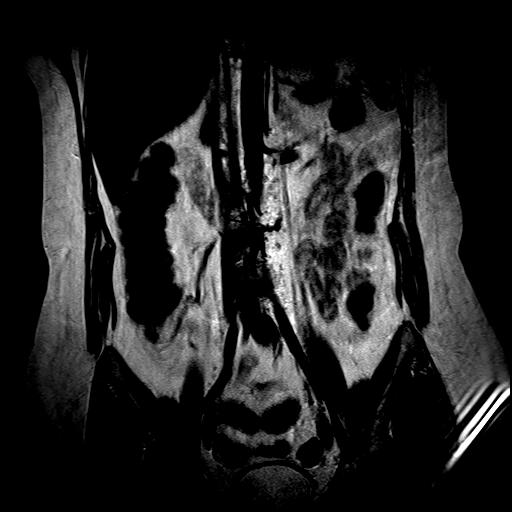
[im 18/18]
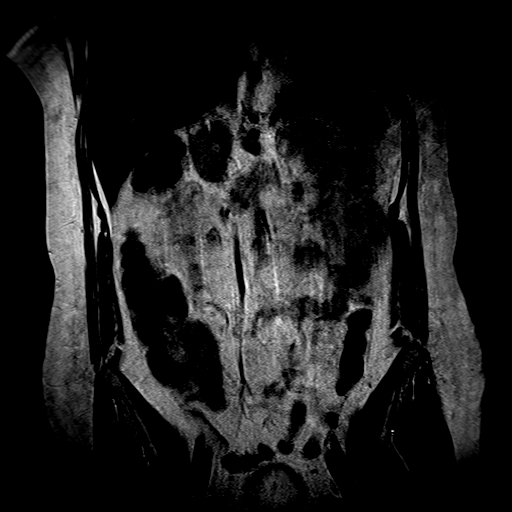

[Series 12: T2 · axial · 4.0mm · 0.52mm/px · z∈[-162,+41]mm · 11 of 23 slices shown (3 of 3)]
[im 1/23]
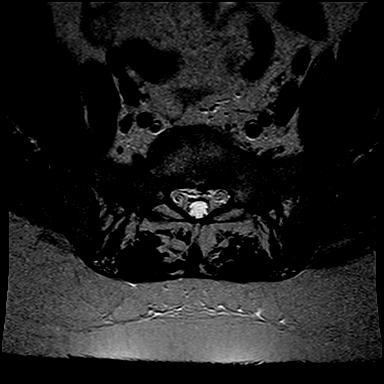
[im 3/23]
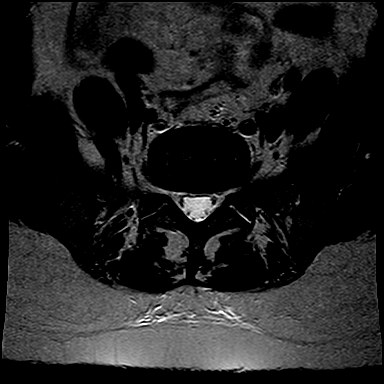
[im 5/23]
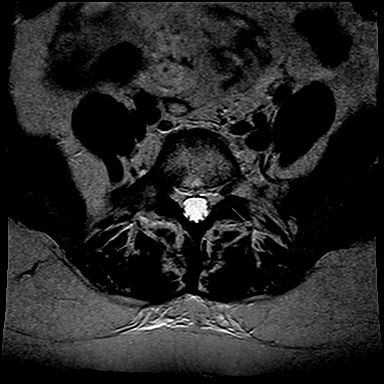
[im 7/23]
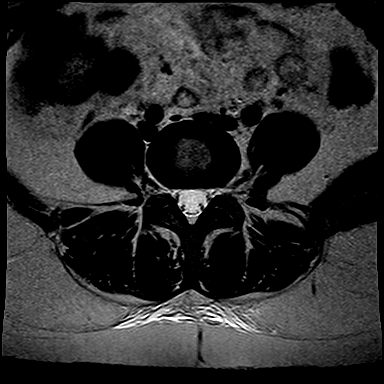
[im 9/23]
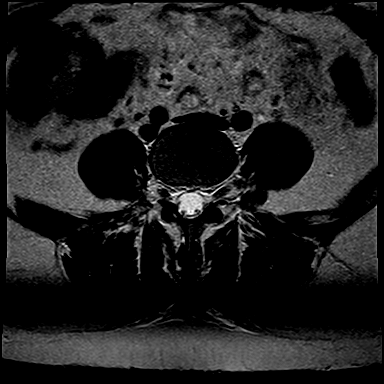
[im 12/23]
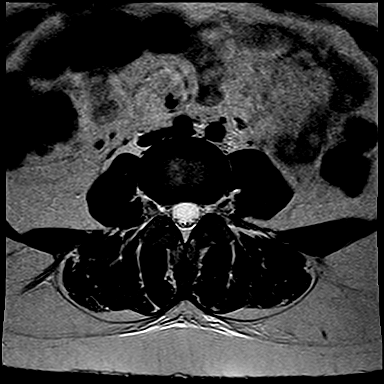
[im 14/23]
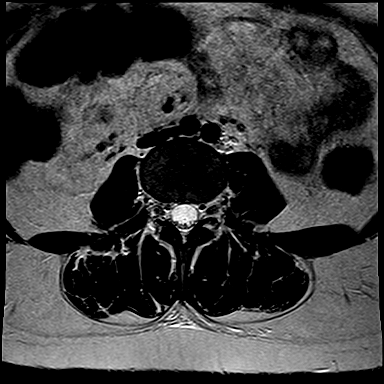
[im 16/23]
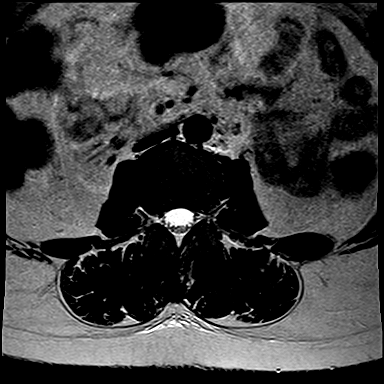
[im 18/23]
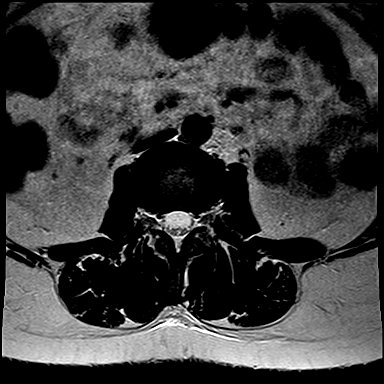
[im 20/23]
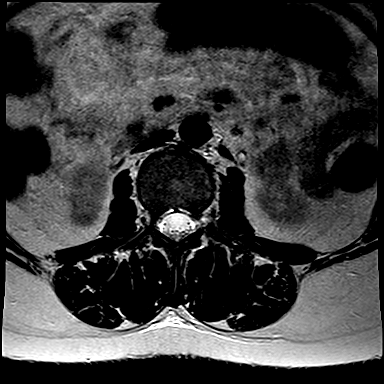
[im 23/23]
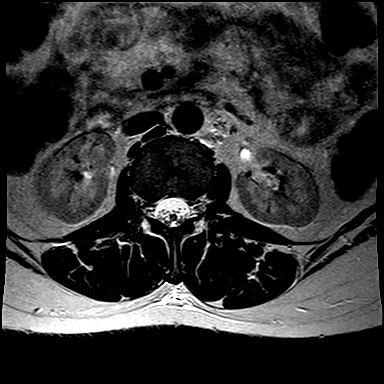

[Series 13: T1 · axial · 4.0mm · 0.52mm/px · 1 of 23 slices shown (2 of 2)]
[im 1/23]
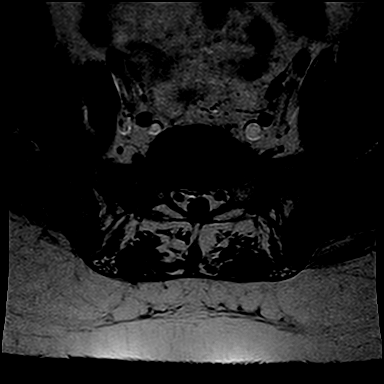

[32 of 48 positions shown; findings below may reference images not displayed]

FINDINGS: No acute bony lesions of lumbar vertebrae.  Lower spinal cord and cauda equina are normal. 

At L1-2 level no focal disc lesions are seen.  No focal lesions are seen at L2-3 and L3-4 levels.  Mild bilateral facet arthropathy is causing minimal compromise of lateral recesses at L3-4 level. 

At L4-5 level, degenerative disc changes and facet arthropathy are causing minimal compromise of both lateral recesses similar to previous examination. 

At L5-S1 level, there is significant loss of disc space at L5-S1 level.  Modic type 2 inflammatory changes on both sides of the disc are noted.  Large disc protrusion that was noted to the left of midline at this level has completely resolved.  Bulging annulus and facet arthropathy are causing moderate compromise of both lateral recesses and neural foramina.
IMPRESSION: 1. Significant loss of disc space at L5-S1 level with Modic inflammatory changes. 

2. A large disc protrusion that was noted at this level to the left of midline on the prior study of 07/13/2020 has completely resolved.  

3. Relatively normal findings at other levels without interval change.

## 2022-03-21 ENCOUNTER — Ambulatory Visit: Payer: 59 | Attending: Orthopaedic Surgery | Admitting: Orthopaedic Surgery

## 2022-03-21 ENCOUNTER — Other Ambulatory Visit: Payer: Self-pay

## 2022-03-21 ENCOUNTER — Encounter (INDEPENDENT_AMBULATORY_CARE_PROVIDER_SITE_OTHER): Payer: Self-pay | Admitting: Orthopaedic Surgery

## 2022-03-21 DIAGNOSIS — M545 Low back pain, unspecified: Secondary | ICD-10-CM | POA: Insufficient documentation

## 2022-03-21 DIAGNOSIS — M5136 Other intervertebral disc degeneration, lumbar region: Secondary | ICD-10-CM | POA: Insufficient documentation

## 2022-03-21 DIAGNOSIS — M48061 Spinal stenosis, lumbar region without neurogenic claudication: Secondary | ICD-10-CM | POA: Insufficient documentation

## 2022-03-21 DIAGNOSIS — M4807 Spinal stenosis, lumbosacral region: Secondary | ICD-10-CM

## 2022-03-21 DIAGNOSIS — M5137 Other intervertebral disc degeneration, lumbosacral region: Secondary | ICD-10-CM

## 2022-03-21 DIAGNOSIS — M5127 Other intervertebral disc displacement, lumbosacral region: Secondary | ICD-10-CM

## 2022-03-21 NOTE — H&P (Signed)
PATIENT NAME: Longwell, Mancos NUMBER:  [id number]  DATE OF SERVICE: 03/21/2022  DATE OF BIRTH:  1980/03/12    HISTORY AND PHYSICAL    CHIEF COMPLAINT:  Followup of injections of bilateral SI joint, CT-guided.    SUBJECTIVE:  Dae is a 42 year old female presenting for the above.  She notes that the right SI joint, got significant relief, however, the left side not so much.  She notes she still has pain in the mid left back in the lower aspect with pretty much any motion.  Notes this is better with ambulation, however, it is worse with things like lifting.  She notes shooting pain on the posterior aspect of the left leg from the thigh to the buttock, and on the lateral aspect of the lower leg and dorsal foot.  Notes that it is more of a sharp pain than a numbness.  Notes that she feels more weak than previously globally rather than a focal distribution.  She denies any change in bowel or bladder habits.  She did have a rheumatoid workup with a normal ESR, CRP, ANA, and rheumatoid factor, as well as a CMP.    OBJECTIVE:  General:  Alert and oriented in no acute distress. Examination of the bilateral lower extremities:  She is able to ambulate with a nonantalgic gait.  On examination of bilateral lower extremities she 5 out of 5 strength and 2 out of 2 sensation.  With straight leg raise of the left lower extremity she has pain in the mid to left side of her low back.  Additionally, has this with contralateral straight leg raise, resisted.  No pain with FABER or FADIR testing.  Really no pain with pelvic compression either.  She is able to stand on 1 leg bilaterally.    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    ASSESSMENT:  A 42 year old female with continued low back pain, SI dysfunction, and radiculopathy.    PLAN:  We discussed with Lauran that as she got some relief from the SI joints that is  encouraging.  We did discuss that she had previously had an L5 nerve root block that helped her with pain more so down the leg.  We discussed trying this L5 selective nerve root block bilaterally again in potential preparation of surgical management if she finds great relief from this.  This was ordered today in clinic.  We will plan to follow up with her 2 weeks after her injection is done to evaluate her current status.      Bradd Burner, MD    I saw and examined the patient.  I reviewed the resident's note.  I agree with the findings and plan of care as documented in the resident's note.  Any exceptions/additions are edited/noted.    Worst thing radiographically is failry severe L5-S1 disc degeneration with collapse of disc space and maybe moderate foraminal stenosis. Has been a bit of a challenge to nail down the pain generator given that she gets some relief of some symptoms from various injections. Will try L5 SNRB for diagnostic and hopefully therapeutic purposes.    Terald Sleeper, MD  Professor  [place] Department of Orthopaedics          DD:  03/21/2022 16:27:01  DT:  03/21/2022 17:01:12 LA  D#:  [id number]

## 2022-05-08 ENCOUNTER — Other Ambulatory Visit: Payer: Self-pay

## 2022-05-08 ENCOUNTER — Encounter (HOSPITAL_COMMUNITY): Admission: RE | Disposition: A | Discharge status: Home / Self Care | Payer: Self-pay | Source: Ambulatory Visit

## 2022-05-08 ENCOUNTER — Ambulatory Visit
Admission: RE | Admit: 2022-05-08 | Discharge: 2022-05-08 | Disposition: A | Discharge status: Home / Self Care | Payer: 59 | Source: Ambulatory Visit | Attending: Orthopaedic Surgery | Admitting: Orthopaedic Surgery

## 2022-05-08 DIAGNOSIS — M47817 Spondylosis without myelopathy or radiculopathy, lumbosacral region: Secondary | ICD-10-CM

## 2022-05-08 DIAGNOSIS — M5137 Other intervertebral disc degeneration, lumbosacral region: Secondary | ICD-10-CM | POA: Insufficient documentation

## 2022-05-08 DIAGNOSIS — M48061 Spinal stenosis, lumbar region without neurogenic claudication: Secondary | ICD-10-CM | POA: Insufficient documentation

## 2022-05-08 SURGERY — IR NERVE BLOCK LUMBAR

## 2022-05-08 MED ORDER — DEXAMETHASONE SODIUM PHOSPHATE (PF) 10 MG/ML INJECTION SOLUTION
INTRAMUSCULAR | Status: AC
Start: 2022-05-08 — End: 2022-05-08
  Filled 2022-05-08: qty 1

## 2022-05-08 MED ORDER — BUPIVACAINE (PF) 0.25 % (2.5 MG/ML) INJECTION SOLUTION
2.0000 mL | Freq: Once | INTRAMUSCULAR | Status: AC
Start: 2022-05-08 — End: 2022-05-08
  Administered 2022-05-08: 2 mL

## 2022-05-08 MED ORDER — DEXAMETHASONE SODIUM PHOSPHATE (PF) 10 MG/ML INJECTION SOLUTION
20.0000 mg | Freq: Once | INTRAMUSCULAR | Status: AC
Start: 2022-05-08 — End: 2022-05-08
  Administered 2022-05-08: 20 mg via EPIDURAL

## 2022-05-08 MED ORDER — IOPAMIDOL 200 MG IODINE/ML (41 %) INTRATHECAL SOLUTION
2.0000 mL | INTRATHECAL | Status: AC
Start: 2022-05-08 — End: 2022-05-08
  Administered 2022-05-08: 2 mL via EPIDURAL

## 2022-05-08 MED ORDER — BUPIVACAINE (PF) 0.25 % (2.5 MG/ML) INJECTION SOLUTION
INTRAMUSCULAR | Status: AC
Start: 2022-05-08 — End: 2022-05-08
  Filled 2022-05-08: qty 30

## 2022-05-08 MED ORDER — BUPIVACAINE (PF) 0.25 % (2.5 MG/ML) INJECTION SOLUTION
6.0000 mL | Freq: Once | INTRAMUSCULAR | Status: AC
Start: 2022-05-08 — End: 2022-05-08
  Administered 2022-05-08: 6 mL via EPIDURAL

## 2022-05-08 SURGICAL SUPPLY — 3 items
CONV USE 337891 - PACK SURG CSTM EPIDRL STRD INJ STRL DISP NRB LF (DRAPE/PACKS/SHEETS/OR TOWEL) ×1
CONV USE 337891 - PACK SURG CUSTOM EPIDRL STRD INJ STRL DISP NRB LF (DRAPE/PACKS/SHEETS/OR TOWEL) ×1 IMPLANT
PACK SURG CSTM EPIDRL STRD INJ STRL DISP NRB LF (DRAPE/PACKS/SHEETS/OR TOWEL) ×1

## 2022-05-28 ENCOUNTER — Other Ambulatory Visit: Payer: Self-pay

## 2022-05-28 ENCOUNTER — Ambulatory Visit (INDEPENDENT_AMBULATORY_CARE_PROVIDER_SITE_OTHER): Payer: 59 | Admitting: Orthopaedic Surgery

## 2022-05-28 ENCOUNTER — Encounter (INDEPENDENT_AMBULATORY_CARE_PROVIDER_SITE_OTHER): Payer: Self-pay | Admitting: Orthopaedic Surgery

## 2022-05-28 DIAGNOSIS — M48061 Spinal stenosis, lumbar region without neurogenic claudication: Secondary | ICD-10-CM | POA: Insufficient documentation

## 2022-05-28 DIAGNOSIS — M5416 Radiculopathy, lumbar region: Secondary | ICD-10-CM | POA: Insufficient documentation

## 2022-05-28 DIAGNOSIS — M5136 Other intervertebral disc degeneration, lumbar region: Secondary | ICD-10-CM | POA: Insufficient documentation

## 2022-05-28 NOTE — Progress Notes (Signed)
[name]  [address]  [place] [address]  [phone]              Date of Service: 05/28/2022  Name: April Sullivan  Date of Birth: [birth year]    CHIEF COMPLAINT: Follow Up After Injection     SUBJECTIVE: April Sullivan is a 42 y.o. woman who presents for follow-up after bilateral L5 selective nerve root blocks.  She reports for 13 days she had 95% relief of her pain in her low back, buttocks, and down her legs.  She states of the injections this one provided the most relief.  Of note, she did say she has a new numbness that comes and goes with sitting more in the left leg, right leg started with a couple of weeks ago that she describes as a "heaviness." This goes away with positional change.  The pain is worse with lying down on her back and while sitting and twisting to the side.  It is improved with standing and walking.  She is a nonsmoker.    PHYSICAL EXAMINATION:   General Assessment: Patient is alert and oriented and in no signs of distress.  Respiratory: No increased work of breathing or plainly audible adventitious sounds.  Cardiovascular:  2+ Peripheral pulses.   Skin: clear/intact  Neurologic: AOx3. Cranial nerves 2-12 and sensation grossly intact.  Musculoskeletal:   Spurling's test: not done   Gait and Station:normal gait and station  Straight Leg Raise: Left: negative Right:negative  Crossed Straight Leg Raise: negative   Facet loading: negative   Musculoskeletal Tenderness: positive  Extremities: no cyanosis, clubbing or edema present    Sensation:   Lower Ext. Inner Thigh Lat Thigh Lat Leg Dorsum Foot Lat Foot PL Leg   Right 2/2 2/2 2/2 2/2 2/2 2/2   Left 2/2 2/2 2/2 2/2 2/2 2/2     Manual Muscle Grading:  Lower Ext. Hip Flexors Quads Tib Anterior  EHL Gastroc/Soleus   Right 5/5 5/5 5/5 5/5 5/5   Left 5/5 5/5 5/5 5/5 5/5     Reflexes:  Lower Ext. Patellar Achilles   Right 1 1   Left 1 1     Ankle clonus: Left: Not  present Right:Not present    IMAGING  Independent review of imaging on 05/28/2022     MRI lumbar  obtained on 08/02/21 was reviewed with Dr. 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       ASSESSMENT AND PLAN   Patient is a 42 y.o. 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 We did have a full surgical discussion about an ALIF at L5-S1 including risks, benefits and recovery expectations, as well as alternatives which would include more conservative therapies such as injections, PT, medication etc. Patient would like to try one more injection. We ordered an L5-S1 ESI with IR.     I have answered all questions and the patient is pleased with the plan of treatment. If the patient has any questions or concerns, the patient can contact us.   I have seen this patient in conjunction  with the cosigning physician present.  The date and time stamp below reflect the action of opening and starting the note and do not reflect the visit start  time, end time or visit length.    Shela Commons, APRN,NP-C 05/28/2022    I personally saw and examined the patient. See mid-level's note for additional details. My findings are excellent short term relief with L5 root block. Her pain seems to change (had improvement in some symptoms with the SI injection), but when questioned, she notes that the best overall pain relief has come from both of her L5 root blocks. She has DDD at L5-S1 and as a result of disc space collapse has some foraminal stenosis that I suspect is impinging the L5 roots.  I think it is reasonable to consider surgery for her at this point, we discussed possible L5-S1 ALIF. We discussed risks/benefits/alternatives including continued nonoperative treatment, specifically discussed risk for adjacent segment degeneration, which is relatively high for her given her young age; we also discussed postoperative course and rehabilitation. She will think  about how she would like to proceed and contact us to let us know. All questions answered.     Terald Sleeper, MD   Professor  Department of Orthopaedics

## 2022-07-05 ENCOUNTER — Ambulatory Visit
Admission: RE | Admit: 2022-07-05 | Discharge: 2022-07-05 | Disposition: A | Discharge status: Home / Self Care | Payer: 59 | Source: Ambulatory Visit | Attending: Orthopaedic Surgery | Admitting: Orthopaedic Surgery

## 2022-07-05 ENCOUNTER — Encounter (HOSPITAL_COMMUNITY): Admission: RE | Disposition: A | Discharge status: Home / Self Care | Payer: Self-pay | Source: Ambulatory Visit

## 2022-07-05 ENCOUNTER — Other Ambulatory Visit: Payer: Self-pay

## 2022-07-05 DIAGNOSIS — M5137 Other intervertebral disc degeneration, lumbosacral region: Secondary | ICD-10-CM

## 2022-07-05 DIAGNOSIS — M5416 Radiculopathy, lumbar region: Secondary | ICD-10-CM | POA: Insufficient documentation

## 2022-07-05 DIAGNOSIS — M4807 Spinal stenosis, lumbosacral region: Secondary | ICD-10-CM

## 2022-07-05 DIAGNOSIS — M48061 Spinal stenosis, lumbar region without neurogenic claudication: Secondary | ICD-10-CM | POA: Insufficient documentation

## 2022-07-05 SURGERY — IR EPIDURAL STEROID INJECTION LUMBAR

## 2022-07-05 MED ORDER — DEXAMETHASONE SODIUM PHOSPHATE (PF) 10 MG/ML INJECTION SOLUTION
15.0000 mg | Freq: Once | INTRAMUSCULAR | Status: AC
Start: 2022-07-05 — End: 2022-07-05
  Administered 2022-07-05: 15 mg via EPIDURAL

## 2022-07-05 MED ORDER — DEXAMETHASONE SODIUM PHOSPHATE (PF) 10 MG/ML INJECTION SOLUTION
INTRAMUSCULAR | Status: AC
Start: 2022-07-05 — End: 2022-07-05
  Filled 2022-07-05: qty 2

## 2022-07-05 MED ORDER — BUPIVACAINE (PF) 0.25 % (2.5 MG/ML) INJECTION SOLUTION
INTRAMUSCULAR | Status: AC
Start: 2022-07-05 — End: 2022-07-05
  Filled 2022-07-05: qty 30

## 2022-07-05 MED ORDER — BUPIVACAINE (PF) 0.25 % (2.5 MG/ML) INJECTION SOLUTION
3.0000 mL | Freq: Once | INTRAMUSCULAR | Status: AC
Start: 2022-07-05 — End: 2022-07-05
  Administered 2022-07-05: 3 mL via EPIDURAL

## 2022-07-05 MED ORDER — BUPIVACAINE (PF) 0.25 % (2.5 MG/ML) INJECTION SOLUTION
3.0000 mL | Freq: Once | INTRAMUSCULAR | Status: AC
Start: 2022-07-05 — End: 2022-07-05
  Administered 2022-07-05: 3 mL

## 2022-07-05 MED ORDER — IOPAMIDOL 200 MG IODINE/ML (41 %) INTRATHECAL SOLUTION
2.0000 mL | INTRATHECAL | Status: AC
Start: 2022-07-05 — End: 2022-07-05
  Administered 2022-07-05: 2 mL via EPIDURAL

## 2022-07-05 SURGICAL SUPPLY — 1 items
NEEDLE EPIDRL YW 4.5IN 20GA TUOHY METAL PLASTIC BVL STY REM WNG SLIDE DEPTH INDICATOR STRL LF  DISP (MED SURG SUPPLIES) ×1 IMPLANT

## 2022-07-24 ENCOUNTER — Encounter (INDEPENDENT_AMBULATORY_CARE_PROVIDER_SITE_OTHER): Payer: Self-pay

## 2023-01-09 ENCOUNTER — Encounter (INDEPENDENT_AMBULATORY_CARE_PROVIDER_SITE_OTHER): Payer: Self-pay | Admitting: Orthopaedic Surgery

## 2023-01-09 ENCOUNTER — Ambulatory Visit: Payer: 59 | Attending: Orthopaedic Surgery | Admitting: Orthopaedic Surgery

## 2023-01-09 ENCOUNTER — Other Ambulatory Visit: Payer: Self-pay

## 2023-01-09 DIAGNOSIS — M5416 Radiculopathy, lumbar region: Secondary | ICD-10-CM | POA: Insufficient documentation

## 2023-01-09 DIAGNOSIS — M48061 Spinal stenosis, lumbar region without neurogenic claudication: Secondary | ICD-10-CM | POA: Insufficient documentation

## 2023-01-09 NOTE — Progress Notes (Signed)
[facility]                              Orthopaedics, [facility]  [address]  [place] [address]  [phone]              Date of Service: 01/09/2023  Name: April Sullivan  Date of Birth: [birth year]    CHIEF COMPLAINT: Low Back Pain    SUBJECTIVE: April Sullivan is a 42 y.o. woman who presents for follow-up after L5-S1 ESI.  She reports for 5 months she had 95% relief of her pain in her low back, buttocks, and down her legs.  She states of the injections this one provided the most relief.  The pain is worse with lying down on her back and while sitting and twisting to the side.  It is improved with standing and walking.  She is a nonsmoker.    PHYSICAL EXAMINATION:   General Assessment: Patient is alert and oriented and in no signs of distress.  Respiratory: No increased work of breathing or plainly audible adventitious sounds.  Cardiovascular:  2+ Peripheral pulses.   Skin: clear/intact  Neurologic: AOx3. Cranial nerves 2-12 and sensation grossly intact.  Musculoskeletal:   Spurling's test: not done   Gait and Station:normal gait and station  Straight Leg Raise: Left: negative Right:negative  Crossed Straight Leg Raise: negative   Facet loading: negative   Musculoskeletal Tenderness: positive  Extremities: no cyanosis, clubbing or edema present    Sensation:   Lower Ext. Inner Thigh Lat Thigh Lat Leg Dorsum Foot Lat Foot PL Leg   Right 2/2 2/2 2/2 2/2 2/2 2/2   Left 2/2 2/2 2/2 2/2 2/2 2/2     Manual Muscle Grading:  Lower Ext. Hip Flexors Quads Tib Anterior  EHL Gastroc/Soleus   Right 5/5 5/5 5/5 5/5 5/5   Left 5/5 5/5 5/5 5/5 5/5     Reflexes:  Lower Ext. Patellar Achilles   Right 1 1   Left 1 1       ASSESSMENT AND PLAN   Patient is a 42 y.o. 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 Since she had long term relief of her symptoms, we discussed that we should repeat the injection and see how she does. We ordered an L5-S1 ESI with IR. Will plan to follow  up 2 weeks after injection.    I have answered all questions and the patient is pleased with the plan of treatment. If the patient has any questions or concerns, the patient can contact us.   I have seen this patient in conjunction  with the cosigning physician present.  The date and time stamp below reflect the action of opening and starting the note and do not reflect the visit start time, end time or visit length.    Derenda Fennel, APRN,NP-C 01/09/2023    I personally saw and examined the patient. See mid-level's note for additional details. My findings are excellent relief with L5-S1 ESI for 4-5 months. Had previously had L5 SNRBs, notes this has been the best injection. Symptoms have returned, would like to try another injection as she wants to hold off on surgery (L5-S1 ALIF) as ling as possible. Will repeat this injection.      Neta Ehlers, MD   Professor  Department of Orthopaedics

## 2023-01-30 ENCOUNTER — Encounter (INDEPENDENT_AMBULATORY_CARE_PROVIDER_SITE_OTHER): Payer: Self-pay | Admitting: Orthopaedic Surgery

## 2023-02-12 ENCOUNTER — Encounter (HOSPITAL_COMMUNITY): Admission: RE | Disposition: A | Discharge status: Home / Self Care | Payer: Self-pay | Source: Home / Self Care

## 2023-02-12 ENCOUNTER — Other Ambulatory Visit: Payer: Self-pay

## 2023-02-12 ENCOUNTER — Ambulatory Visit
Admission: RE | Admit: 2023-02-12 | Discharge: 2023-02-12 | Disposition: A | Discharge status: Home / Self Care | Payer: 59 | Source: Home / Self Care

## 2023-02-12 DIAGNOSIS — M51379 Other intervertebral disc degeneration, lumbosacral region without mention of lumbar back pain or lower extremity pain: Secondary | ICD-10-CM

## 2023-02-12 DIAGNOSIS — M5416 Radiculopathy, lumbar region: Secondary | ICD-10-CM | POA: Insufficient documentation

## 2023-02-12 DIAGNOSIS — M4807 Spinal stenosis, lumbosacral region: Secondary | ICD-10-CM

## 2023-02-12 SURGERY — IR EPIDURAL STEROID INJECTION LUMBAR

## 2023-02-12 MED ORDER — BUPIVACAINE (PF) 0.25 % (2.5 MG/ML) INJECTION SOLUTION
3.0000 mL | Freq: Once | INTRAMUSCULAR | Status: AC
Start: 2023-02-12 — End: 2023-02-12
  Administered 2023-02-12: 3 mL via EPIDURAL

## 2023-02-12 MED ORDER — IOPAMIDOL 200 MG IODINE/ML (41 %) INTRATHECAL SOLUTION
2.0000 mL | INTRATHECAL | Status: AC
Start: 2023-02-12 — End: 2023-02-12
  Administered 2023-02-12: 2 mL via EPIDURAL

## 2023-02-12 MED ORDER — DEXAMETHASONE SODIUM PHOSPHATE (PF) 10 MG/ML INJECTION SOLUTION
INTRAMUSCULAR | Status: AC
Start: 2023-02-12 — End: 2023-02-12
  Filled 2023-02-12: qty 2

## 2023-02-12 MED ORDER — BUPIVACAINE (PF) 0.25 % (2.5 MG/ML) INJECTION SOLUTION
2.0000 mL | Freq: Once | INTRAMUSCULAR | Status: AC
Start: 2023-02-12 — End: 2023-02-12
  Administered 2023-02-12: 2 mL

## 2023-02-12 MED ORDER — DEXAMETHASONE SODIUM PHOSPHATE (PF) 10 MG/ML INJECTION SOLUTION
15.0000 mg | Freq: Once | INTRAMUSCULAR | Status: AC
Start: 2023-02-12 — End: 2023-02-12
  Administered 2023-02-12: 15 mg via EPIDURAL

## 2023-02-12 SURGICAL SUPPLY — 4 items
CONV USE 342537 - TRAY ESI NRB PACK - RUBY MEM HOSP (CUSTOM TRAYS & PACK) ×1 IMPLANT
NEEDLE EPIDRL YW 4.5IN 20GA TUOHY METAL PLASTIC BVL STY REM WNG SLIDE DEPTH INDICATOR STRL LF  DISP (MED SURG SUPPLIES) ×1 IMPLANT
NEEDLE EPIDRL YW 6IN 20GA TUOHY METAL PLASTIC BVL STY REM WNG SLIDE DEPTH INDICATOR STRL LF  DISP (MED SURG SUPPLIES) ×1 IMPLANT
TRAY ESI NRB PACK - RUBY MEM HOSP (CUSTOM TRAYS & PACK) ×1

## 2023-02-12 NOTE — Ancillary Notes (Signed)
 After Visit Summary - Discharge Instructions  Minimally Invasive Nerve Root Block (NRB)/Epidural Steroid Injection (ESI)/Arthrogram        You may eat your normal diet.  You may shower. Do not soak the injection site for 24 hours (no bathing, hot tub, swimming pools).   A responsible adult must drive you home- you must not drive yourself. Do not participate in strenuous activity or drive for the remainder of the day.  You should limit your activity and not drive for 24 hours  You may experience temporary numbness or weakness in your legs after the injection. When the numbing medication wears off, this should go away.  If you are taking a blood thinner, you may restart it in 24 hours or the following day as directed at the time of the procedure.   If the site of the injection is painful, you may apply an ice pack for up to 20 min at a time to the area to reduce the discomfort or take pain medication as directed by your physician.   FOR DIABETIC PATIENTS: It is important for you to know that if you have diabetes, the steroid can cause elevation in your blood sugar level for up to 2 weeks after the injection. If you have diabetes and regularly check your own blood sugar, you should check your blood sugar more often during the first several days after an epidural steroid injection. Please talk with the doctor who helps to manage your diabetes for instructions on how to change your diet and/or diabetes medication if your blood sugar is elevated.  When should I call for help? If you develop a fever (>100.4 F), chills, increasing pain, redness or swelling at the injection site, weakness or sensory changes or, rarely, changes in bladder and/or bowel function.      If you have any questions, please call the following:  Monday - Friday 8am - 430pm: [phone]. Select option 1 or 2.  Evenings and weekends: [phone]- ask the concierge to page the Radiology Resident on-call.  In the event of a life-threatening  emergency, please call 911.

## 2023-05-20 ENCOUNTER — Encounter (INDEPENDENT_AMBULATORY_CARE_PROVIDER_SITE_OTHER): Payer: Self-pay | Admitting: Adult Health

## 2023-05-20 ENCOUNTER — Other Ambulatory Visit (INDEPENDENT_AMBULATORY_CARE_PROVIDER_SITE_OTHER): Payer: Self-pay | Admitting: Adult Health

## 2023-05-20 NOTE — Nursing Note (Signed)
 Patient called in stating she had return of her back and leg pain that travels in L5 distribution. She states that she had 95% relief for several months with the injections. She feels that the bilateral TF nerve blocks at L5 helped with leg pain more and the epidural at L5-S1 was helpful for the low back pain more. She states activities that are limited since pain returned are sleeping, sitting, and walking on uneven terrain. We discussed repeating the bilateral L5 SNRBs and then if needed we could repeat the epidural at L5-S1 about 3 months after.    Rosetta Cons, APRN,NP-C

## 2023-06-06 ENCOUNTER — Encounter (HOSPITAL_COMMUNITY): Disposition: A | Discharge status: Home / Self Care | Payer: Self-pay | Source: Home / Self Care

## 2023-06-06 ENCOUNTER — Ambulatory Visit
Admit: 2023-06-06 | Discharge: 2023-06-06 | Disposition: A | Discharge status: Home / Self Care | Attending: Orthopaedic Surgery | Admitting: Orthopaedic Surgery

## 2023-06-06 ENCOUNTER — Other Ambulatory Visit: Payer: Self-pay

## 2023-06-06 DIAGNOSIS — M5416 Radiculopathy, lumbar region: Secondary | ICD-10-CM | POA: Insufficient documentation

## 2023-06-06 SURGERY — IR NERVE BLOCK LUMBAR
Anesthesia: Other | Laterality: Bilateral

## 2023-06-06 MED ORDER — IOPAMIDOL 200 MG IODINE/ML (41 %) INTRATHECAL SOLUTION
3.0000 mL | INTRATHECAL | Status: AC
Start: 2023-06-06 — End: 2023-06-06
  Administered 2023-06-06: 3 mL via EPIDURAL

## 2023-06-06 MED ORDER — BUPIVACAINE (PF) 0.25 % (2.5 MG/ML) INJECTION SOLUTION
4.0000 mL | Freq: Once | INTRAMUSCULAR | Status: AC
Start: 2023-06-06 — End: 2023-06-06
  Administered 2023-06-06: 4 mL

## 2023-06-06 MED ORDER — BUPIVACAINE (PF) 0.25 % (2.5 MG/ML) INJECTION SOLUTION
INTRAMUSCULAR | Status: AC
Start: 2023-06-06 — End: 2023-06-06
  Filled 2023-06-06: qty 30

## 2023-06-06 MED ORDER — DEXAMETHASONE SODIUM PHOSPHATE (PF) 10 MG/ML INJECTION SOLUTION
INTRAMUSCULAR | Status: AC
Start: 2023-06-06 — End: 2023-06-06
  Filled 2023-06-06: qty 2

## 2023-06-06 MED ORDER — BUPIVACAINE (PF) 0.25 % (2.5 MG/ML) INJECTION SOLUTION
6.0000 mL | Freq: Once | INTRAMUSCULAR | Status: AC
Start: 2023-06-06 — End: 2023-06-06
  Administered 2023-06-06: 6 mL via EPIDURAL

## 2023-06-06 MED ORDER — DEXAMETHASONE SODIUM PHOSPHATE (PF) 10 MG/ML INJECTION SOLUTION
20.0000 mg | Freq: Once | INTRAMUSCULAR | Status: AC
Start: 2023-06-06 — End: 2023-06-06
  Administered 2023-06-06: 20 mg via EPIDURAL

## 2023-06-06 SURGICAL SUPPLY — 3 items
CUSTOM ESI NRB PACK ~~LOC~~ - RUBY MEMORIAL HOSPITAL (CUSTOM TRAYS & PACK) ×1 IMPLANT
ESI NRB PACK ~~LOC~~ - RUBY MEMORIAL HOSPITAL (CUSTOM TRAYS & PACK) ×1 IMPLANT
NEEDLE BIOPSY 22GA 15CM CHIBA STRL DISP ASP (MED SURG SUPPLIES) ×1 IMPLANT

## 2023-09-11 ENCOUNTER — Encounter (INDEPENDENT_AMBULATORY_CARE_PROVIDER_SITE_OTHER): Payer: Self-pay | Admitting: Adult Health

## 2023-09-13 ENCOUNTER — Other Ambulatory Visit (INDEPENDENT_AMBULATORY_CARE_PROVIDER_SITE_OTHER): Payer: Self-pay | Admitting: Adult Health

## 2023-09-13 DIAGNOSIS — M48061 Spinal stenosis, lumbar region without neurogenic claudication: Secondary | ICD-10-CM | POA: Insufficient documentation

## 2023-09-13 NOTE — Nursing Note (Signed)
 Patient reports getting about 80% relief for about 3 months with this last injection. She states that alternating between the nerve block and ESI have helped tremendously with improving her daily function and pain. She would like to have ESI repeated.     Cloyd Moats, APRN,NP-C

## 2023-09-26 ENCOUNTER — Encounter (HOSPITAL_COMMUNITY): Admission: RE | Disposition: A | Discharge status: Home / Self Care | Payer: Self-pay | Source: Ambulatory Visit

## 2023-09-26 ENCOUNTER — Other Ambulatory Visit: Payer: Self-pay

## 2023-09-26 ENCOUNTER — Ambulatory Visit
Admission: RE | Admit: 2023-09-26 | Discharge: 2023-09-26 | Disposition: A | Discharge status: Home / Self Care | Source: Ambulatory Visit | Attending: Orthopaedic Surgery | Admitting: Orthopaedic Surgery

## 2023-09-26 DIAGNOSIS — M48061 Spinal stenosis, lumbar region without neurogenic claudication: Secondary | ICD-10-CM | POA: Insufficient documentation

## 2023-09-26 SURGERY — IR EPIDURAL STEROID INJECTION LUMBAR

## 2023-09-26 MED ORDER — BUPIVACAINE (PF) 0.25 % (2.5 MG/ML) INJECTION SOLUTION
INTRAMUSCULAR | Status: AC
Start: 2023-09-26 — End: 2023-09-26
  Filled 2023-09-26: qty 30

## 2023-09-26 MED ORDER — IOPAMIDOL 200 MG IODINE/ML (41 %) INTRATHECAL SOLUTION
2.0000 mL | INTRATHECAL | Status: AC
Start: 2023-09-26 — End: 2023-09-26
  Administered 2023-09-26: 2 mL via EPIDURAL

## 2023-09-26 MED ORDER — DEXAMETHASONE SODIUM PHOSPHATE (PF) 10 MG/ML INJECTION SOLUTION
15.0000 mg | Freq: Once | INTRAMUSCULAR | Status: AC
Start: 2023-09-26 — End: 2023-09-26
  Administered 2023-09-26: 15 mg via EPIDURAL

## 2023-09-26 MED ORDER — BUPIVACAINE (PF) 0.25 % (2.5 MG/ML) INJECTION SOLUTION
4.0000 mL | Freq: Once | INTRAMUSCULAR | Status: AC
Start: 2023-09-26 — End: 2023-09-26
  Administered 2023-09-26: 4 mL via EPIDURAL

## 2023-09-26 MED ORDER — BUPIVACAINE (PF) 0.25 % (2.5 MG/ML) INJECTION SOLUTION
2.0000 mL | Freq: Once | INTRAMUSCULAR | Status: AC
Start: 2023-09-26 — End: 2023-09-26
  Administered 2023-09-26: 2 mL

## 2023-09-26 MED ORDER — DEXAMETHASONE SODIUM PHOSPHATE (PF) 10 MG/ML INJECTION SOLUTION
INTRAMUSCULAR | Status: AC
Start: 2023-09-26 — End: 2023-09-26
  Filled 2023-09-26: qty 2

## 2023-09-26 NOTE — Ancillary Notes (Signed)
 After Visit Summary - Discharge Instructions  Minimally Invasive Nerve Root Block (NRB)/Epidural Steroid Injection (ESI)/Arthrogram        You may eat your normal diet.  You may shower. Do not soak the injection site for 24 hours (no bathing, hot tub, swimming pools).   A responsible adult must drive you home- you must not drive yourself. Do not participate in strenuous activity or drive for the remainder of the day.  You should limit your activity and not drive for 24 hours  You may experience temporary numbness or weakness in your legs after the injection. When the numbing medication wears off, this should go away.  If you are taking a blood thinner, you may restart it in 24 hours or the following day as directed at the time of the procedure.   If the site of the injection is painful, you may apply an ice pack for up to 20 min at a time to the area to reduce the discomfort or take pain medication as directed by your physician.   FOR DIABETIC PATIENTS: It is important for you to know that if you have diabetes, the steroid can cause elevation in your blood sugar level for up to 2 weeks after the injection. If you have diabetes and regularly check your own blood sugar, you should check your blood sugar more often during the first several days after an epidural steroid injection. Please talk with the doctor who helps to manage your diabetes for instructions on how to change your diet and/or diabetes medication if your blood sugar is elevated.  When should I call for help? If you develop a fever (>100.4 F), chills, increasing pain, redness or swelling at the injection site, weakness or sensory changes or, rarely, changes in bladder and/or bowel function.      If you have any questions, please call the following:  Monday - Friday 8am - 430pm: [phone]. Select option 1 or 2.  Evenings and weekends: [phone] ask the concierge to page the Radiology Resident on-call.  In the event of a life-threatening  emergency, please call 911.

## 2023-10-14 ENCOUNTER — Encounter (INDEPENDENT_AMBULATORY_CARE_PROVIDER_SITE_OTHER): Payer: Self-pay | Admitting: Adult Health

## 2023-10-15 ENCOUNTER — Encounter (INDEPENDENT_AMBULATORY_CARE_PROVIDER_SITE_OTHER): Payer: Self-pay | Admitting: Adult Health

## 2023-10-15 NOTE — Nursing Note (Signed)
 Patient messaged in to follow up after L5-S1 ESI. She states that her pain in the lower back has been minimal thus far. She has noticed some nerve flare ups here and there. In post procedure assessment she had 100% relief of pain.     Cloyd Moats, APRN,NP-C

## 2024-02-10 ENCOUNTER — Encounter (INDEPENDENT_AMBULATORY_CARE_PROVIDER_SITE_OTHER): Payer: Self-pay | Admitting: Adult Health

## 2024-02-11 ENCOUNTER — Other Ambulatory Visit (INDEPENDENT_AMBULATORY_CARE_PROVIDER_SITE_OTHER): Payer: Self-pay | Admitting: Adult Health

## 2024-02-19 ENCOUNTER — Ambulatory Visit: Attending: Orthopaedic Surgery | Admitting: Orthopaedic Surgery

## 2024-02-19 DIAGNOSIS — M5416 Radiculopathy, lumbar region: Secondary | ICD-10-CM

## 2024-02-19 DIAGNOSIS — M48061 Spinal stenosis, lumbar region without neurogenic claudication: Secondary | ICD-10-CM

## 2024-02-19 NOTE — Progress Notes (Signed)
 [facility] MEDICINE  TELEMEDICINE CONSULTATION     [facility]      Name: April Sullivan  MRN: [id number]  DOB: 1980-09-04    Date of Service:  02/19/2024    TELEMEDICINE DOCUMENTATION:  Patient Location:  MyChart video visit from home address: [address]  [place] [address]   Patient/family aware of provider location:  yes  Patient/family consent for telemedicine:  yes  Examination observed and performed by:  Glendia Barrio, MD    Chief Complaint:  BLE radicular pain    History of Present Illness:  April Sullivan is a 43 y.o. female  history of lumbar stenosis. Has been getting injections periodically which have been giving good relief, most recently alternating between L5 SNRB and L5-S1 ESI. Has had return of symptoms and would like repeat injection. Describes back pain and posterior BLE pain, occasionally into great toe. Worst thing is buttock pain. Back pain when trying to sleep. No new injuries. Standing, walking make worse. No weakness. Did not tolerate gabapentin  well in past. Taking ibuprofen. Continues to do stretches/HEP at home.    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 Lumbar radicular pain  M54.16       2. Lumbar  foraminal stenosis  M48.061          Orders Placed This Encounter    CASE REQUEST IR LAB: IR NERVE BLOCK LUMBAR     Follow up:  Return will call/message after injection.    On the day of the encounter, a total of  35 minutes was spent on this patient encounter including review of historical information, review of imaging, examination, counseling, documentation and post-visit activities.       Glendia Barrio, MD 02/19/2024, 10:50  Professor  Department of Orthopaedics

## 2024-03-26 ENCOUNTER — Other Ambulatory Visit: Payer: Self-pay

## 2024-03-26 ENCOUNTER — Ambulatory Visit
Admission: RE | Admit: 2024-03-26 | Discharge: 2024-03-26 | Disposition: A | Discharge status: Home / Self Care | Source: Ambulatory Visit | Attending: Orthopaedic Surgery | Admitting: Orthopaedic Surgery

## 2024-03-26 ENCOUNTER — Encounter (HOSPITAL_COMMUNITY): Admission: RE | Disposition: A | Discharge status: Home / Self Care | Payer: Self-pay | Source: Ambulatory Visit

## 2024-03-26 DIAGNOSIS — M48061 Spinal stenosis, lumbar region without neurogenic claudication: Secondary | ICD-10-CM | POA: Insufficient documentation

## 2024-03-26 MED ORDER — BUPIVACAINE (PF) 0.25 % (2.5 MG/ML) INJECTION SOLUTION
6.0000 mL | Freq: Once | INTRAMUSCULAR | Status: AC
  Administered 2024-03-26: 6 mL via EPIDURAL

## 2024-03-26 MED ORDER — DEXAMETHASONE SODIUM PHOSPHATE (PF) 10 MG/ML INJECTION SOLUTION
INTRAMUSCULAR | Status: AC
  Filled 2024-03-26: qty 4

## 2024-03-26 MED ORDER — BUPIVACAINE (PF) 0.25 % (2.5 MG/ML) INJECTION SOLUTION
INTRAMUSCULAR | Status: AC
  Filled 2024-03-26: qty 30

## 2024-03-26 MED ORDER — DEXAMETHASONE SODIUM PHOSPHATE (PF) 10 MG/ML INJECTION SOLUTION
16.0000 mg | Freq: Once | INTRAMUSCULAR | Status: AC
  Administered 2024-03-26: 16 mg via EPIDURAL

## 2024-03-26 MED ORDER — IOPAMIDOL 200 MG IODINE/ML (41 %) INTRATHECAL SOLUTION
4.0000 mL | INTRATHECAL | Status: AC
  Administered 2024-03-26: 4 mL via EPIDURAL

## 2024-03-26 MED ORDER — BUPIVACAINE (PF) 0.25 % (2.5 MG/ML) INJECTION SOLUTION
4.0000 mL | Freq: Once | INTRAMUSCULAR | Status: AC
  Administered 2024-03-26: 4 mL

## 2024-03-26 NOTE — Ancillary Notes (Signed)
 After Visit Summary - Discharge Instructions  Minimally Invasive Nerve Root Block (NRB)/Epidural Steroid Injection (ESI)/Arthrogram               You may eat your normal diet.         You may shower. Do not soak the injection site for 24 hours (no bathing, hot tub, swimming pools).          A responsible adult must drive you home- you must not drive yourself. Do not participate in strenuous activity or drive for the remainder of the day.         You should limit your activity and not drive for 24 hours         You may experience temporary numbness or weakness in your legs after the injection. When the numbing medication wears off, this should go away.         If you are taking a blood thinner, you may restart it in 24 hours or the following day as directed at the time of the procedure.         If the site of the injection is painful, you may apply an ice pack for up to 20 min at a time to the area to reduce the discomfort or take pain medication as directed by your physician.         FOR DIABETIC PATIENTS: It is important for you to know that if you have diabetes, the steroid can cause elevation in your blood sugar level for up to 2 weeks after the injection. If you have diabetes and regularly check your own blood sugar, you should check your blood sugar more often during the first several days after an epidural steroid injection. Please talk with the doctor who helps to manage your diabetes for instructions on how to change your diet and/or diabetes medication if your blood sugar is elevated.         When should I call for help? If you develop a fever (>100.4 F), chills, increasing pain, redness or swelling at the injection site, weakness or sensory changes or, rarely, changes in bladder and/or bowel function.      If you have any questions, please call the following:  Dr. Debby and Dr. Kara Call: Monday - Friday 8am - 430pm: [phone]. Select option 1 or 2.    Dr. Flint and Dr. Manuelita Call:  [phone] Monday-Friday 7am - 5pm.     In the event of a life-threatening emergency, please call 911.
